# Patient Record
Sex: Male | Born: 1945 | Hispanic: No | Smoking: Current some day smoker
Health system: Southern US, Community
[De-identification: ages and names within clinical notes are randomized; demographics above are authoritative.]

## PROBLEM LIST (undated history)

## (undated) DIAGNOSIS — F419 Anxiety disorder, unspecified: Secondary | ICD-10-CM

## (undated) DIAGNOSIS — R12 Heartburn: Secondary | ICD-10-CM

## (undated) HISTORY — DX: Heartburn: R12

## (undated) HISTORY — PX: COLONOSCOPY: SHX174

---

## 2003-10-01 ENCOUNTER — Other Ambulatory Visit: Admission: RE | Admit: 2003-10-01 | Discharge: 2003-10-01 | Payer: Self-pay | Admitting: Dermatology

## 2003-12-25 ENCOUNTER — Emergency Department (HOSPITAL_COMMUNITY): Admission: EM | Admit: 2003-12-25 | Discharge: 2003-12-26 | Payer: Self-pay | Admitting: Emergency Medicine

## 2015-11-19 DIAGNOSIS — Z0000 Encounter for general adult medical examination without abnormal findings: Secondary | ICD-10-CM | POA: Diagnosis not present

## 2015-11-19 DIAGNOSIS — Z1389 Encounter for screening for other disorder: Secondary | ICD-10-CM | POA: Diagnosis not present

## 2015-11-19 DIAGNOSIS — Z6828 Body mass index (BMI) 28.0-28.9, adult: Secondary | ICD-10-CM | POA: Diagnosis not present

## 2015-11-19 DIAGNOSIS — K210 Gastro-esophageal reflux disease with esophagitis: Secondary | ICD-10-CM | POA: Diagnosis not present

## 2015-11-26 DIAGNOSIS — R1084 Generalized abdominal pain: Secondary | ICD-10-CM | POA: Diagnosis not present

## 2015-11-26 DIAGNOSIS — N281 Cyst of kidney, acquired: Secondary | ICD-10-CM | POA: Diagnosis not present

## 2015-11-26 DIAGNOSIS — K8020 Calculus of gallbladder without cholecystitis without obstruction: Secondary | ICD-10-CM | POA: Diagnosis not present

## 2016-02-18 DIAGNOSIS — Z6828 Body mass index (BMI) 28.0-28.9, adult: Secondary | ICD-10-CM | POA: Diagnosis not present

## 2016-02-18 DIAGNOSIS — K210 Gastro-esophageal reflux disease with esophagitis: Secondary | ICD-10-CM | POA: Diagnosis not present

## 2016-05-20 DIAGNOSIS — K210 Gastro-esophageal reflux disease with esophagitis: Secondary | ICD-10-CM | POA: Diagnosis not present

## 2016-05-20 DIAGNOSIS — R69 Illness, unspecified: Secondary | ICD-10-CM | POA: Diagnosis not present

## 2016-05-20 DIAGNOSIS — Z6827 Body mass index (BMI) 27.0-27.9, adult: Secondary | ICD-10-CM | POA: Diagnosis not present

## 2016-08-19 DIAGNOSIS — R69 Illness, unspecified: Secondary | ICD-10-CM | POA: Diagnosis not present

## 2016-08-19 DIAGNOSIS — K210 Gastro-esophageal reflux disease with esophagitis: Secondary | ICD-10-CM | POA: Diagnosis not present

## 2016-08-19 DIAGNOSIS — E663 Overweight: Secondary | ICD-10-CM | POA: Diagnosis not present

## 2016-08-19 DIAGNOSIS — Z6827 Body mass index (BMI) 27.0-27.9, adult: Secondary | ICD-10-CM | POA: Diagnosis not present

## 2016-08-19 DIAGNOSIS — L570 Actinic keratosis: Secondary | ICD-10-CM | POA: Diagnosis not present

## 2016-11-25 DIAGNOSIS — E663 Overweight: Secondary | ICD-10-CM | POA: Diagnosis not present

## 2016-11-25 DIAGNOSIS — K210 Gastro-esophageal reflux disease with esophagitis: Secondary | ICD-10-CM | POA: Diagnosis not present

## 2016-11-25 DIAGNOSIS — Z6827 Body mass index (BMI) 27.0-27.9, adult: Secondary | ICD-10-CM | POA: Diagnosis not present

## 2016-11-25 DIAGNOSIS — R69 Illness, unspecified: Secondary | ICD-10-CM | POA: Diagnosis not present

## 2017-02-17 DIAGNOSIS — K210 Gastro-esophageal reflux disease with esophagitis: Secondary | ICD-10-CM | POA: Diagnosis not present

## 2017-02-17 DIAGNOSIS — R69 Illness, unspecified: Secondary | ICD-10-CM | POA: Diagnosis not present

## 2017-02-17 DIAGNOSIS — Z125 Encounter for screening for malignant neoplasm of prostate: Secondary | ICD-10-CM | POA: Diagnosis not present

## 2017-02-17 DIAGNOSIS — E663 Overweight: Secondary | ICD-10-CM | POA: Diagnosis not present

## 2017-02-17 DIAGNOSIS — Z6827 Body mass index (BMI) 27.0-27.9, adult: Secondary | ICD-10-CM | POA: Diagnosis not present

## 2017-02-17 DIAGNOSIS — Z79899 Other long term (current) drug therapy: Secondary | ICD-10-CM | POA: Diagnosis not present

## 2017-03-02 DIAGNOSIS — N200 Calculus of kidney: Secondary | ICD-10-CM | POA: Diagnosis not present

## 2017-03-02 DIAGNOSIS — N281 Cyst of kidney, acquired: Secondary | ICD-10-CM | POA: Diagnosis not present

## 2017-03-02 DIAGNOSIS — K8020 Calculus of gallbladder without cholecystitis without obstruction: Secondary | ICD-10-CM | POA: Diagnosis not present

## 2017-03-02 DIAGNOSIS — I700 Atherosclerosis of aorta: Secondary | ICD-10-CM | POA: Diagnosis not present

## 2017-03-10 DIAGNOSIS — K8020 Calculus of gallbladder without cholecystitis without obstruction: Secondary | ICD-10-CM | POA: Diagnosis not present

## 2017-03-10 DIAGNOSIS — N281 Cyst of kidney, acquired: Secondary | ICD-10-CM | POA: Diagnosis not present

## 2017-03-10 DIAGNOSIS — N2889 Other specified disorders of kidney and ureter: Secondary | ICD-10-CM | POA: Diagnosis not present

## 2017-03-10 DIAGNOSIS — K449 Diaphragmatic hernia without obstruction or gangrene: Secondary | ICD-10-CM | POA: Diagnosis not present

## 2017-05-19 DIAGNOSIS — E663 Overweight: Secondary | ICD-10-CM | POA: Diagnosis not present

## 2017-05-19 DIAGNOSIS — K210 Gastro-esophageal reflux disease with esophagitis: Secondary | ICD-10-CM | POA: Diagnosis not present

## 2017-05-19 DIAGNOSIS — Z6827 Body mass index (BMI) 27.0-27.9, adult: Secondary | ICD-10-CM | POA: Diagnosis not present

## 2017-05-19 DIAGNOSIS — R69 Illness, unspecified: Secondary | ICD-10-CM | POA: Diagnosis not present

## 2017-07-10 DIAGNOSIS — Z1211 Encounter for screening for malignant neoplasm of colon: Secondary | ICD-10-CM | POA: Diagnosis not present

## 2017-08-16 DIAGNOSIS — Z6827 Body mass index (BMI) 27.0-27.9, adult: Secondary | ICD-10-CM | POA: Diagnosis not present

## 2017-08-16 DIAGNOSIS — R69 Illness, unspecified: Secondary | ICD-10-CM | POA: Diagnosis not present

## 2017-08-16 DIAGNOSIS — E663 Overweight: Secondary | ICD-10-CM | POA: Diagnosis not present

## 2017-08-16 DIAGNOSIS — Z1389 Encounter for screening for other disorder: Secondary | ICD-10-CM | POA: Diagnosis not present

## 2017-08-16 DIAGNOSIS — Z0000 Encounter for general adult medical examination without abnormal findings: Secondary | ICD-10-CM | POA: Diagnosis not present

## 2017-08-16 DIAGNOSIS — K210 Gastro-esophageal reflux disease with esophagitis: Secondary | ICD-10-CM | POA: Diagnosis not present

## 2017-08-16 DIAGNOSIS — R7303 Prediabetes: Secondary | ICD-10-CM | POA: Diagnosis not present

## 2017-11-16 DIAGNOSIS — K210 Gastro-esophageal reflux disease with esophagitis: Secondary | ICD-10-CM | POA: Diagnosis not present

## 2017-11-16 DIAGNOSIS — Z6827 Body mass index (BMI) 27.0-27.9, adult: Secondary | ICD-10-CM | POA: Diagnosis not present

## 2017-11-16 DIAGNOSIS — R69 Illness, unspecified: Secondary | ICD-10-CM | POA: Diagnosis not present

## 2017-11-16 DIAGNOSIS — E663 Overweight: Secondary | ICD-10-CM | POA: Diagnosis not present

## 2018-02-14 DIAGNOSIS — R69 Illness, unspecified: Secondary | ICD-10-CM | POA: Diagnosis not present

## 2018-02-14 DIAGNOSIS — Z6827 Body mass index (BMI) 27.0-27.9, adult: Secondary | ICD-10-CM | POA: Diagnosis not present

## 2018-02-14 DIAGNOSIS — K210 Gastro-esophageal reflux disease with esophagitis: Secondary | ICD-10-CM | POA: Diagnosis not present

## 2018-05-17 DIAGNOSIS — K210 Gastro-esophageal reflux disease with esophagitis: Secondary | ICD-10-CM | POA: Diagnosis not present

## 2018-05-17 DIAGNOSIS — R69 Illness, unspecified: Secondary | ICD-10-CM | POA: Diagnosis not present

## 2018-05-17 DIAGNOSIS — Z6826 Body mass index (BMI) 26.0-26.9, adult: Secondary | ICD-10-CM | POA: Diagnosis not present

## 2018-05-18 DIAGNOSIS — Z1159 Encounter for screening for other viral diseases: Secondary | ICD-10-CM | POA: Diagnosis not present

## 2018-05-18 DIAGNOSIS — R69 Illness, unspecified: Secondary | ICD-10-CM | POA: Diagnosis not present

## 2018-05-18 DIAGNOSIS — K210 Gastro-esophageal reflux disease with esophagitis: Secondary | ICD-10-CM | POA: Diagnosis not present

## 2018-05-18 DIAGNOSIS — Z6827 Body mass index (BMI) 27.0-27.9, adult: Secondary | ICD-10-CM | POA: Diagnosis not present

## 2018-08-20 DIAGNOSIS — Z6827 Body mass index (BMI) 27.0-27.9, adult: Secondary | ICD-10-CM | POA: Diagnosis not present

## 2018-08-20 DIAGNOSIS — Z0000 Encounter for general adult medical examination without abnormal findings: Secondary | ICD-10-CM | POA: Diagnosis not present

## 2018-08-20 DIAGNOSIS — K210 Gastro-esophageal reflux disease with esophagitis: Secondary | ICD-10-CM | POA: Diagnosis not present

## 2018-08-20 DIAGNOSIS — Z1389 Encounter for screening for other disorder: Secondary | ICD-10-CM | POA: Diagnosis not present

## 2018-08-20 DIAGNOSIS — R69 Illness, unspecified: Secondary | ICD-10-CM | POA: Diagnosis not present

## 2018-11-20 DIAGNOSIS — Z6827 Body mass index (BMI) 27.0-27.9, adult: Secondary | ICD-10-CM | POA: Diagnosis not present

## 2018-11-20 DIAGNOSIS — K210 Gastro-esophageal reflux disease with esophagitis: Secondary | ICD-10-CM | POA: Diagnosis not present

## 2018-11-20 DIAGNOSIS — R69 Illness, unspecified: Secondary | ICD-10-CM | POA: Diagnosis not present

## 2019-02-20 DIAGNOSIS — I10 Essential (primary) hypertension: Secondary | ICD-10-CM | POA: Diagnosis not present

## 2019-02-20 DIAGNOSIS — Z6827 Body mass index (BMI) 27.0-27.9, adult: Secondary | ICD-10-CM | POA: Diagnosis not present

## 2019-02-20 DIAGNOSIS — R69 Illness, unspecified: Secondary | ICD-10-CM | POA: Diagnosis not present

## 2019-02-20 DIAGNOSIS — K210 Gastro-esophageal reflux disease with esophagitis: Secondary | ICD-10-CM | POA: Diagnosis not present

## 2019-05-22 DIAGNOSIS — Z6828 Body mass index (BMI) 28.0-28.9, adult: Secondary | ICD-10-CM | POA: Diagnosis not present

## 2019-05-22 DIAGNOSIS — I10 Essential (primary) hypertension: Secondary | ICD-10-CM | POA: Diagnosis not present

## 2019-05-22 DIAGNOSIS — K210 Gastro-esophageal reflux disease with esophagitis: Secondary | ICD-10-CM | POA: Diagnosis not present

## 2019-05-22 DIAGNOSIS — R69 Illness, unspecified: Secondary | ICD-10-CM | POA: Diagnosis not present

## 2019-08-22 DIAGNOSIS — R69 Illness, unspecified: Secondary | ICD-10-CM | POA: Diagnosis not present

## 2019-08-22 DIAGNOSIS — K219 Gastro-esophageal reflux disease without esophagitis: Secondary | ICD-10-CM | POA: Diagnosis not present

## 2019-08-22 DIAGNOSIS — Z0000 Encounter for general adult medical examination without abnormal findings: Secondary | ICD-10-CM | POA: Diagnosis not present

## 2019-08-22 DIAGNOSIS — Z6827 Body mass index (BMI) 27.0-27.9, adult: Secondary | ICD-10-CM | POA: Diagnosis not present

## 2019-08-22 DIAGNOSIS — I10 Essential (primary) hypertension: Secondary | ICD-10-CM | POA: Diagnosis not present

## 2019-08-22 DIAGNOSIS — Z1389 Encounter for screening for other disorder: Secondary | ICD-10-CM | POA: Diagnosis not present

## 2019-08-23 DIAGNOSIS — Z125 Encounter for screening for malignant neoplasm of prostate: Secondary | ICD-10-CM | POA: Diagnosis not present

## 2019-11-21 DIAGNOSIS — R69 Illness, unspecified: Secondary | ICD-10-CM | POA: Diagnosis not present

## 2019-11-21 DIAGNOSIS — Z6827 Body mass index (BMI) 27.0-27.9, adult: Secondary | ICD-10-CM | POA: Diagnosis not present

## 2019-11-21 DIAGNOSIS — I10 Essential (primary) hypertension: Secondary | ICD-10-CM | POA: Diagnosis not present

## 2019-11-21 DIAGNOSIS — K2100 Gastro-esophageal reflux disease with esophagitis, without bleeding: Secondary | ICD-10-CM | POA: Diagnosis not present

## 2020-11-11 ENCOUNTER — Ambulatory Visit (HOSPITAL_COMMUNITY)
Admission: RE | Admit: 2020-11-11 | Discharge: 2020-11-11 | Disposition: A | Discharge status: Home / Self Care | Payer: Medicare HMO | Source: Ambulatory Visit | Attending: Urology | Admitting: Urology

## 2020-11-11 ENCOUNTER — Encounter: Payer: Self-pay | Admitting: Urology

## 2020-11-11 ENCOUNTER — Other Ambulatory Visit: Payer: Self-pay

## 2020-11-11 ENCOUNTER — Ambulatory Visit (INDEPENDENT_AMBULATORY_CARE_PROVIDER_SITE_OTHER): Payer: Medicare HMO | Admitting: Urology

## 2020-11-11 VITALS — BP 164/92 | HR 73 | Temp 98.4°F | Ht 76.0 in | Wt 216.0 lb

## 2020-11-11 DIAGNOSIS — N200 Calculus of kidney: Secondary | ICD-10-CM | POA: Diagnosis present

## 2020-11-11 MED ORDER — ONDANSETRON 4 MG PO TBDP: 4.0000 mg | ORAL_TABLET | Freq: Three times a day (TID) | ORAL | 0 refills | Status: DC | PRN

## 2020-11-11 MED ORDER — TAMSULOSIN HCL 0.4 MG PO CAPS
0.4000 mg | ORAL_CAPSULE | Freq: Every day | ORAL | 1 refills | Status: AC
Start: 2020-11-11 — End: ?

## 2020-11-11 NOTE — Progress Notes (Signed)
Urological Symptom Review  Patient is experiencing the following symptoms: Get up at night to urinate Weak stream   Review of Systems  Gastrointestinal (upper)  : Negative for upper GI symptoms  Gastrointestinal (lower) : Negative for lower GI symptoms  Constitutional : Negative for symptoms  Skin: Negative for skin symptoms  Eyes: Negative for eye symptoms  Ear/Nose/Throat : negative  Hematologic/Lymphatic: Negative for Hematologic/Lymphatic symptoms  Cardiovascular : Negative for cardiovascular symptoms  Respiratory : Negative for respiratory symptoms  Endocrine: Negative for endocrine symptoms  Musculoskeletal: Negative for musculoskeletal symptoms  Neurological: Negative for neurological symptoms  Psychologic: Negative for psychiatric symptoms

## 2020-11-11 NOTE — Patient Instructions (Signed)

## 2020-11-12 ENCOUNTER — Telehealth: Payer: Self-pay

## 2020-11-12 LAB — URINALYSIS, ROUTINE W REFLEX MICROSCOPIC
Bilirubin, UA: NEGATIVE
Glucose, UA: NEGATIVE
Ketones, UA: NEGATIVE
Leukocytes,UA: NEGATIVE
Nitrite, UA: NEGATIVE
Protein,UA: NEGATIVE
Specific Gravity, UA: 1.02 (ref 1.005–1.030)
Urobilinogen, Ur: 0.2 mg/dL (ref 0.2–1.0)
pH, UA: 5.5 (ref 5.0–7.5)

## 2020-11-12 LAB — MICROSCOPIC EXAMINATION
Bacteria, UA: NONE SEEN
Epithelial Cells (non renal): NONE SEEN /hpf (ref 0–10)
Renal Epithel, UA: NONE SEEN /hpf
WBC, UA: NONE SEEN /hpf (ref 0–5)

## 2020-11-13 ENCOUNTER — Other Ambulatory Visit: Payer: Self-pay

## 2020-11-13 ENCOUNTER — Ambulatory Visit: Payer: Medicare HMO

## 2020-11-13 DIAGNOSIS — N200 Calculus of kidney: Secondary | ICD-10-CM

## 2020-11-13 NOTE — Progress Notes (Signed)
11/11/2020 9:18 AM   Cameron Chaney [birth year] [id number]  Referring provider: Toma Deiters, MD [address] [place],  [place] [address]  Flank pain  HPI: Cameron Chaney is a 74yo here for evaluation of left ureteral calculus. He presented to [facility] [place] on 10/13/2020 and was diagnosed with a 13mm left proximal ureteral calculus. He has not passed his calculus. He continues to have sharp, intermittent, mild to moderate left flank pain. He has associated nausea and decreased appetite. No vomiting. No other associated symptoms. No exacerbating/alleviating events. He denies any worsening LUTS. No hematuria.   PMH: Past Medical History:  Diagnosis Date  . Heartburn     Surgical History: History reviewed. No pertinent surgical history.  Home Medications:  Allergies as of 11/11/2020   No Known Allergies     Medication List       Accurate as of November 11, 2020 11:59 PM. If you have any questions, ask your nurse or doctor.        ALPRAZolam 0.5 MG tablet Commonly known as: XANAX Take by mouth.   aspirin 81 MG chewable tablet Chew by mouth daily.   calcium carbonate 1500 (600 Ca) MG Tabs tablet Commonly known as: OSCAL Take by mouth 2 (two) times daily with a meal.   mirtazapine 15 MG tablet Commonly known as: REMERON Take 15 mg by mouth at bedtime.   ondansetron 4 MG disintegrating tablet Commonly known as: Zofran ODT Take 1 tablet (4 mg total) by mouth every 8 (eight) hours as needed for nausea or vomiting. Started by: Cameron Aye, MD   PROTONIX PO Take by mouth daily as needed.   tamsulosin 0.4 MG Caps capsule Commonly known as: FLOMAX Take 1 capsule (0.4 mg total) by mouth daily after supper. What changed: when to take this Changed by: Cameron Aye, MD       Allergies: No Known Allergies  Family History: History reviewed. No pertinent family history.  Social History:  reports that he has been smoking. He has smoked for the past 20.00  years. He has never used smokeless tobacco. No history on file for alcohol use and drug use.  ROS: All other review of systems were reviewed and are negative except what is noted above in HPI  Physical Exam: BP (!) 164/92   Pulse 73   Temp 98.4 F (36.9 C)   Ht 6\' 4"  (1.93 m)   Wt 216 lb (98 kg)   BMI 26.29 kg/m   Constitutional:  Alert and oriented, No acute distress. HEENT: Cameron Chaney AT, moist mucus membranes.  Trachea midline, no masses. Cardiovascular: No clubbing, cyanosis, or edema. Respiratory: Normal respiratory effort, no increased work of breathing. GI: Abdomen is soft, nontender, nondistended, no abdominal masses GU: No CVA tenderness.  Lymph: No cervical or inguinal lymphadenopathy. Skin: No rashes, bruises or suspicious lesions. Neurologic: Grossly intact, no focal deficits, moving all 4 extremities. Psychiatric: Normal mood and affect.  Laboratory Data: No results found for: WBC, HGB, HCT, MCV, PLT  No results found for: CREATININE  No results found for: PSA  No results found for: TESTOSTERONE  No results found for: HGBA1C  Urinalysis    Component Value Date/Time   APPEARANCEUR Clear 11/11/2020 1609   GLUCOSEU Negative 11/11/2020 1609   BILIRUBINUR Negative 11/11/2020 1609   PROTEINUR Negative 11/11/2020 1609   NITRITE Negative 11/11/2020 1609   LEUKOCYTESUR Negative 11/11/2020 1609    Lab Results  Component Value Date   LABMICR See below: 11/11/2020  WBCUA None seen 11/11/2020   LABEPIT None seen 11/11/2020   BACTERIA None seen 11/11/2020    Pertinent Imaging: CT 10/13/2020: Images reviewed and discussed with the patient Results for orders placed in visit on 11/11/20  Abdomen 1 view (KUB)  Narrative CLINICAL DATA:  Nephrolithiasis  EXAM: ABDOMEN - 1 VIEW  COMPARISON:  Correlation made with prior CT 10/13/2020  FINDINGS: There are no definite urinary tract calculi identified. Bowel gas pattern is unremarkable. No acute osseous  abnormality.  IMPRESSION: No definite urinary tract calculi identified.   Electronically Signed By: Cameron  Chaney M.D. On: 11/11/2020 17:03  No results found for this or any previous visit.  No results found for this or any previous visit.  No results found for this or any previous visit.  No results found for this or any previous visit.  No results found for this or any previous visit.  No results found for this or any previous visit.  No results found for this or any previous visit.   Assessment & Plan:    1. Kidney stones -We discussed the management of kidney stones. These options include observation, ureteroscopy, shockwave lithotripsy (ESWL) and percutaneous nephrolithotomy (PCNL). We discussed which options are relevant to the patient's stone(s). We discussed the natural history of kidney stones as well as the complications of untreated stones and the impact on quality of life without treatment as well as with each of the above listed treatments. We also discussed the efficacy of each treatment in its ability to clear the stone burden. With any of these management options I discussed the signs and symptoms of infection and the need for emergent treatment should these be experienced. For each option we discussed the ability of each procedure to clear the patient of their stone burden.   For observation I described the risks which include but are not limited to silent renal damage, life-threatening infection, need for emergent surgery, failure to pass stone and pain.   For ureteroscopy I described the risks which include bleeding, infection, damage to contiguous structures, positioning injury, ureteral stricture, ureteral avulsion, ureteral injury, need for prolonged ureteral stent, inability to perform ureteroscopy, need for an interval procedure, inability to clear stone burden, stent discomfort/pain, heart attack, stroke, pulmonary embolus and the inherent risks with general  anesthesia.   For shockwave lithotripsy I described the risks which include arrhythmia, kidney contusion, kidney hemorrhage, need for transfusion, pain, inability to adequately break up stone, inability to pass stone fragments, Steinstrasse, infection associated with obstructing stones, need for alternate surgical procedure, need for repeat shockwave lithotripsy, MI, CVA, PE and the inherent risks with anesthesia/conscious sedation.   For PCNL I described the risks including positioning injury, pneumothorax, hydrothorax, need for chest tube, inability to clear stone burden, renal laceration, arterial venous fistula or malformation, need for embolization of kidney, loss of kidney or renal function, need for repeat procedure, need for prolonged nephrostomy tube, ureteral avulsion, MI, CVA, PE and the inherent risks of general anesthesia.   - The patient would like to proceed with Left ESWL  - Urinalysis, Routine w reflex microscopic - Abdomen 1 view (KUB)   No follow-ups on file.  Hadden Steig, MD  [facility] [place]   

## 2020-11-13 NOTE — H&P (View-Only) (Signed)
11/11/2020 9:18 AM   Cameron Chaney [birth year] [id number]  Referring provider: Toma Deiters, MD [address] [place],  [place] [address]  Flank pain  HPI: [name] is a 75yo here for evaluation of left ureteral calculus. He presented to [facility] [place] on 10/13/2020 and was diagnosed with a 13mm left proximal ureteral calculus. He has not passed his calculus. He continues to have sharp, intermittent, mild to moderate left flank pain. He has associated nausea and decreased appetite. No vomiting. No other associated symptoms. No exacerbating/alleviating events. He denies any worsening LUTS. No hematuria.   PMH: Past Medical History:  Diagnosis Date  . Heartburn     Surgical History: History reviewed. No pertinent surgical history.  Home Medications:  Allergies as of 11/11/2020   No Known Allergies     Medication List       Accurate as of November 11, 2020 11:59 PM. If you have any questions, ask your nurse or doctor.        ALPRAZolam 0.5 MG tablet Commonly known as: XANAX Take by mouth.   aspirin 81 MG chewable tablet Chew by mouth daily.   calcium carbonate 1500 (600 Ca) MG Tabs tablet Commonly known as: OSCAL Take by mouth 2 (two) times daily with a meal.   mirtazapine 15 MG tablet Commonly known as: REMERON Take 15 mg by mouth at bedtime.   ondansetron 4 MG disintegrating tablet Commonly known as: Zofran ODT Take 1 tablet (4 mg total) by mouth every 8 (eight) hours as needed for nausea or vomiting. Started by: Wilkie Aye, MD   PROTONIX PO Take by mouth daily as needed.   tamsulosin 0.4 MG Caps capsule Commonly known as: FLOMAX Take 1 capsule (0.4 mg total) by mouth daily after supper. What changed: when to take this Changed by: Wilkie Aye, MD       Allergies: No Known Allergies  Family History: History reviewed. No pertinent family history.  Social History:  reports that he has been smoking. He has smoked for the past 20.00  years. He has never used smokeless tobacco. No history on file for alcohol use and drug use.  ROS: All other review of systems were reviewed and are negative except what is noted above in HPI  Physical Exam: BP (!) 164/92   Pulse 73   Temp 98.4 F (36.9 C)   Ht 6\' 4"  (1.93 m)   Wt 216 lb (98 kg)   BMI 26.29 kg/m   Constitutional:  Alert and oriented, No acute distress. HEENT: [place] AT, moist mucus membranes.  Trachea midline, no masses. Cardiovascular: No clubbing, cyanosis, or edema. Respiratory: Normal respiratory effort, no increased work of breathing. GI: Abdomen is soft, nontender, nondistended, no abdominal masses GU: No CVA tenderness.  Lymph: No cervical or inguinal lymphadenopathy. Skin: No rashes, bruises or suspicious lesions. Neurologic: Grossly intact, no focal deficits, moving all 4 extremities. Psychiatric: Normal mood and affect.  Laboratory Data: No results found for: WBC, HGB, HCT, MCV, PLT  No results found for: CREATININE  No results found for: PSA  No results found for: TESTOSTERONE  No results found for: HGBA1C  Urinalysis    Component Value Date/Time   APPEARANCEUR Clear 11/11/2020 1609   GLUCOSEU Negative 11/11/2020 1609   BILIRUBINUR Negative 11/11/2020 1609   PROTEINUR Negative 11/11/2020 1609   NITRITE Negative 11/11/2020 1609   LEUKOCYTESUR Negative 11/11/2020 1609    Lab Results  Component Value Date   LABMICR See below: 11/11/2020  WBCUA None seen 11/11/2020   LABEPIT None seen 11/11/2020   BACTERIA None seen 11/11/2020    Pertinent Imaging: CT 10/13/2020: Images reviewed and discussed with the patient Results for orders placed in visit on 11/11/20  Abdomen 1 view (KUB)  Narrative CLINICAL DATA:  Nephrolithiasis  EXAM: ABDOMEN - 1 VIEW  COMPARISON:  Correlation made with prior CT 10/13/2020  FINDINGS: There are no definite urinary tract calculi identified. Bowel gas pattern is unremarkable. No acute osseous  abnormality.  IMPRESSION: No definite urinary tract calculi identified.   Electronically Signed By: Guadlupe Spanish M.D. On: 11/11/2020 17:03  No results found for this or any previous visit.  No results found for this or any previous visit.  No results found for this or any previous visit.  No results found for this or any previous visit.  No results found for this or any previous visit.  No results found for this or any previous visit.  No results found for this or any previous visit.   Assessment & Plan:    1. Kidney stones -We discussed the management of kidney stones. These options include observation, ureteroscopy, shockwave lithotripsy (ESWL) and percutaneous nephrolithotomy (PCNL). We discussed which options are relevant to the patient's stone(s). We discussed the natural history of kidney stones as well as the complications of untreated stones and the impact on quality of life without treatment as well as with each of the above listed treatments. We also discussed the efficacy of each treatment in its ability to clear the stone burden. With any of these management options I discussed the signs and symptoms of infection and the need for emergent treatment should these be experienced. For each option we discussed the ability of each procedure to clear the patient of their stone burden.   For observation I described the risks which include but are not limited to silent renal damage, life-threatening infection, need for emergent surgery, failure to pass stone and pain.   For ureteroscopy I described the risks which include bleeding, infection, damage to contiguous structures, positioning injury, ureteral stricture, ureteral avulsion, ureteral injury, need for prolonged ureteral stent, inability to perform ureteroscopy, need for an interval procedure, inability to clear stone burden, stent discomfort/pain, heart attack, stroke, pulmonary embolus and the inherent risks with general  anesthesia.   For shockwave lithotripsy I described the risks which include arrhythmia, kidney contusion, kidney hemorrhage, need for transfusion, pain, inability to adequately break up stone, inability to pass stone fragments, Steinstrasse, infection associated with obstructing stones, need for alternate surgical procedure, need for repeat shockwave lithotripsy, MI, CVA, PE and the inherent risks with anesthesia/conscious sedation.   For PCNL I described the risks including positioning injury, pneumothorax, hydrothorax, need for chest tube, inability to clear stone burden, renal laceration, arterial venous fistula or malformation, need for embolization of kidney, loss of kidney or renal function, need for repeat procedure, need for prolonged nephrostomy tube, ureteral avulsion, MI, CVA, PE and the inherent risks of general anesthesia.   - The patient would like to proceed with Left ESWL  - Urinalysis, Routine w reflex microscopic - Abdomen 1 view (KUB)   No follow-ups on file.  Wilkie Aye, MD  [facility] [place]

## 2020-11-13 NOTE — Progress Notes (Signed)
Litho instructions reviewed with patient. Voiced understanding of instructions.

## 2020-11-20 ENCOUNTER — Other Ambulatory Visit: Payer: Self-pay

## 2020-11-20 ENCOUNTER — Encounter (HOSPITAL_COMMUNITY): Payer: Self-pay

## 2020-11-20 ENCOUNTER — Encounter (HOSPITAL_COMMUNITY)
Admission: RE | Admit: 2020-11-20 | Discharge: 2020-11-20 | Disposition: A | Discharge status: Home / Self Care | Payer: Medicare HMO | Source: Ambulatory Visit | Attending: Urology | Admitting: Urology

## 2020-11-20 HISTORY — DX: Anxiety disorder, unspecified: F41.9

## 2020-11-23 ENCOUNTER — Other Ambulatory Visit (HOSPITAL_COMMUNITY)
Admission: RE | Admit: 2020-11-23 | Discharge: 2020-11-23 | Disposition: A | Discharge status: Home / Self Care | Payer: Medicare HMO | Source: Ambulatory Visit | Attending: Urology | Admitting: Urology

## 2020-11-23 ENCOUNTER — Other Ambulatory Visit: Payer: Self-pay

## 2020-11-23 DIAGNOSIS — Z01812 Encounter for preprocedural laboratory examination: Secondary | ICD-10-CM | POA: Insufficient documentation

## 2020-11-23 DIAGNOSIS — Z20822 Contact with and (suspected) exposure to covid-19: Secondary | ICD-10-CM | POA: Insufficient documentation

## 2020-11-23 LAB — SARS CORONAVIRUS 2 (TAT 6-24 HRS): SARS Coronavirus 2: NEGATIVE

## 2020-11-24 ENCOUNTER — Ambulatory Visit (HOSPITAL_COMMUNITY)
Admission: RE | Admit: 2020-11-24 | Discharge: 2020-11-24 | Disposition: A | Discharge status: Home / Self Care | Payer: Medicare HMO | Source: Ambulatory Visit | Attending: Urology | Admitting: Urology

## 2020-11-24 ENCOUNTER — Encounter (HOSPITAL_COMMUNITY)
Admission: RE | Disposition: A | Discharge status: Home / Self Care | Payer: Self-pay | Source: Ambulatory Visit | Attending: Urology

## 2020-11-24 DIAGNOSIS — N200 Calculus of kidney: Secondary | ICD-10-CM | POA: Diagnosis present

## 2020-11-24 DIAGNOSIS — N201 Calculus of ureter: Secondary | ICD-10-CM

## 2020-11-24 HISTORY — PX: EXTRACORPOREAL SHOCK WAVE LITHOTRIPSY: SHX1557

## 2020-11-24 SURGERY — LITHOTRIPSY, ESWL
Anesthesia: LOCAL | Laterality: Left

## 2020-11-24 MED ORDER — SODIUM CHLORIDE 0.9 % IV SOLN: INTRAVENOUS | Status: DC

## 2020-11-24 MED ORDER — ONDANSETRON 4 MG PO TBDP: 4.0000 mg | ORAL_TABLET | Freq: Three times a day (TID) | ORAL | 0 refills | Status: AC | PRN

## 2020-11-24 MED ORDER — DIPHENHYDRAMINE HCL 25 MG PO CAPS
25.0000 mg | ORAL_CAPSULE | ORAL | Status: AC
  Administered 2020-11-24: 25 mg via ORAL
  Filled 2020-11-24: qty 1

## 2020-11-24 MED ORDER — DIAZEPAM 5 MG PO TABS
10.0000 mg | ORAL_TABLET | Freq: Once | ORAL | Status: AC
  Administered 2020-11-24: 10 mg via ORAL
  Filled 2020-11-24: qty 2

## 2020-11-24 MED ORDER — OXYCODONE-ACETAMINOPHEN 5-325 MG PO TABS: 1.0000 | ORAL_TABLET | ORAL | 0 refills | Status: AC | PRN

## 2020-11-24 NOTE — Interval H&P Note (Signed)
History and Physical Interval Note:  11/24/2020 10:17 AM  Cameron Chaney  has presented today for surgery, with the diagnosis of left ureteral calculus.  The various methods of treatment have been discussed with the patient and family. After consideration of risks, benefits and other options for treatment, the patient has consented to  Procedure(s): EXTRACORPOREAL SHOCK WAVE LITHOTRIPSY (ESWL) (Left) as a surgical intervention.  The patient's history has been reviewed, patient examined, no change in status, stable for surgery.  I have reviewed the patient's chart and labs.  Questions were answered to the patient's satisfaction.     Wilkie Aye

## 2020-11-24 NOTE — Interval H&P Note (Signed)
History and Physical Interval Note:  11/24/2020 8:52 AM  Cameron Chaney  has presented today for surgery, with the diagnosis of left ureteral calculus.  The various methods of treatment have been discussed with the patient and family. After consideration of risks, benefits and other options for treatment, the patient has consented to  Procedure(s): EXTRACORPOREAL SHOCK WAVE LITHOTRIPSY (ESWL) (Left) as a surgical intervention.  The patient's history has been reviewed, patient examined, no change in status, stable for surgery.  I have reviewed the patient's chart and labs.  Questions were answered to the patient's satisfaction.     Wilkie Aye

## 2020-11-24 NOTE — Discharge Instructions (Signed)

## 2020-11-26 ENCOUNTER — Encounter (HOSPITAL_COMMUNITY): Payer: Self-pay | Admitting: Urology

## 2020-11-30 NOTE — Telephone Encounter (Signed)
Per Dr. Ronne Binning patient informed to try zofran for  appetite issues. X-ray results given. 11/12/20

## 2020-12-10 ENCOUNTER — Other Ambulatory Visit: Payer: Self-pay

## 2020-12-10 ENCOUNTER — Ambulatory Visit (HOSPITAL_COMMUNITY)
Admission: RE | Admit: 2020-12-10 | Discharge: 2020-12-10 | Disposition: A | Discharge status: Home / Self Care | Payer: Medicare HMO | Source: Ambulatory Visit | Attending: Urology | Admitting: Urology

## 2020-12-10 DIAGNOSIS — N200 Calculus of kidney: Secondary | ICD-10-CM | POA: Diagnosis not present

## 2020-12-11 ENCOUNTER — Ambulatory Visit (INDEPENDENT_AMBULATORY_CARE_PROVIDER_SITE_OTHER): Payer: Medicare HMO | Admitting: Urology

## 2020-12-11 ENCOUNTER — Ambulatory Visit: Payer: Medicare HMO | Admitting: Urology

## 2020-12-11 ENCOUNTER — Encounter: Payer: Self-pay | Admitting: Urology

## 2020-12-11 VITALS — BP 139/84 | HR 79 | Temp 98.2°F | Ht 76.0 in | Wt 212.0 lb

## 2020-12-11 DIAGNOSIS — N200 Calculus of kidney: Secondary | ICD-10-CM

## 2020-12-11 LAB — URINALYSIS, ROUTINE W REFLEX MICROSCOPIC
Bilirubin, UA: NEGATIVE
Glucose, UA: NEGATIVE
Ketones, UA: NEGATIVE
Leukocytes,UA: NEGATIVE
Nitrite, UA: NEGATIVE
Protein,UA: NEGATIVE
Specific Gravity, UA: 1.025 (ref 1.005–1.030)
Urobilinogen, Ur: 0.2 mg/dL (ref 0.2–1.0)
pH, UA: 5 (ref 5.0–7.5)

## 2020-12-11 LAB — MICROSCOPIC EXAMINATION
Bacteria, UA: NONE SEEN
Epithelial Cells (non renal): NONE SEEN /hpf (ref 0–10)
Renal Epithel, UA: NONE SEEN /hpf
WBC, UA: NONE SEEN /hpf (ref 0–5)

## 2020-12-11 NOTE — Progress Notes (Signed)
Urological Symptom Review  Patient is experiencing the following symptoms: Frequent urination Get up at night to urinate Weak stream   Review of Systems  Gastrointestinal (upper)  : Negative for upper GI symptoms  Gastrointestinal (lower) : Negative for lower GI symptoms  Constitutional : Weight loss  Skin: Negative for skin symptoms  Eyes: Negative for eye symptoms  Ear/Nose/Throat : Sinus problems  Hematologic/Lymphatic: Negative for Hematologic/Lymphatic symptoms  Cardiovascular : Negative for cardiovascular symptoms  Respiratory : Negative for respiratory symptoms  Endocrine: Negative for endocrine symptoms  Musculoskeletal: Negative for musculoskeletal symptoms  Neurological: Negative for neurological symptoms  Psychologic: Anxiety

## 2020-12-11 NOTE — Progress Notes (Signed)
12/11/2020 10:45 AM   [name] 07-15-1946 [id number]  Referring provider: Toma Deiters, MD [address] [place],  [place] [address]  followup after ESWL  HPI: [name] is a 75yo here for followup after ESWL. He passed a few fragments. Intermittent left flank pain that is very mild. KUB shows residual distal left ureteral fragments.    PMH: Past Medical History:  Diagnosis Date  . Anxiety   . Heartburn     Surgical History: Past Surgical History:  Procedure Laterality Date  . COLONOSCOPY    . EXTRACORPOREAL SHOCK WAVE LITHOTRIPSY Left 11/24/2020   Procedure: EXTRACORPOREAL SHOCK WAVE LITHOTRIPSY (ESWL);  Surgeon: Malen Gauze, MD;  Location: [facility];  Service: Urology;  Laterality: Left;    Home Medications:  Allergies as of 12/11/2020   No Known Allergies     Medication List       Accurate as of December 11, 2020 10:45 AM. If you have any questions, ask your nurse or doctor.        ALPRAZolam 0.5 MG tablet Commonly known as: XANAX Take 0.5 mg by mouth 3 (three) times daily.   aspirin 81 MG chewable tablet Chew 81 mg by mouth daily.   calcium carbonate 1500 (600 Ca) MG Tabs tablet Commonly known as: OSCAL Take 600 mg of elemental calcium by mouth 2 (two) times daily with a meal.   Fish Oil 1000 MG Caps Take 1,000 mg by mouth in the morning and at bedtime.   ondansetron 4 MG disintegrating tablet Commonly known as: Zofran ODT Take 1 tablet (4 mg total) by mouth every 8 (eight) hours as needed for nausea or vomiting.   oxyCODONE-acetaminophen 5-325 MG tablet Commonly known as: Percocet Take 1 tablet by mouth every 4 (four) hours as needed for severe pain.   pantoprazole 40 MG tablet Commonly known as: PROTONIX Take 40 mg by mouth daily as needed (acid reflux).   tamsulosin 0.4 MG Caps capsule Commonly known as: FLOMAX Take 1 capsule (0.4 mg total) by mouth daily after supper.       Allergies: No Known Allergies  Family  History: History reviewed. No pertinent family history.  Social History:  reports that he has been smoking. He has smoked for the past 20.00 years. He has never used smokeless tobacco. He reports previous alcohol use. He reports that he does not use drugs.  ROS: All other review of systems were reviewed and are negative except what is noted above in HPI  Physical Exam: BP 139/84   Pulse 79   Temp 98.2 F (36.8 C)   Ht 6\' 4"  (1.93 m)   Wt 212 lb (96.2 kg)   BMI 25.81 kg/m   Constitutional:  Alert and oriented, No acute distress. HEENT: [place] AT, moist mucus membranes.  Trachea midline, no masses. Cardiovascular: No clubbing, cyanosis, or edema. Respiratory: Normal respiratory effort, no increased work of breathing. GI: Abdomen is soft, nontender, nondistended, no abdominal masses GU: No CVA tenderness.  Lymph: No cervical or inguinal lymphadenopathy. Skin: No rashes, bruises or suspicious lesions. Neurologic: Grossly intact, no focal deficits, moving all 4 extremities. Psychiatric: Normal mood and affect.  Laboratory Data: No results found for: WBC, HGB, HCT, MCV, PLT  No results found for: CREATININE  No results found for: PSA  No results found for: TESTOSTERONE  No results found for: HGBA1C  Urinalysis    Component Value Date/Time   APPEARANCEUR Clear 11/11/2020 1609   GLUCOSEU Negative 11/11/2020 1609  BILIRUBINUR Negative 11/11/2020 1609   PROTEINUR Negative 11/11/2020 1609   NITRITE Negative 11/11/2020 1609   LEUKOCYTESUR Negative 11/11/2020 1609    Lab Results  Component Value Date   LABMICR See below: 11/11/2020   WBCUA None seen 11/11/2020   LABEPIT None seen 11/11/2020   BACTERIA None seen 11/11/2020    Pertinent Imaging: KUB yesterday: Images reviewed and dicussed with the patient Results for orders placed during the hospital encounter of 12/10/20  DG Abd 1 View  Narrative CLINICAL DATA:  Status post nephrolithotripsy.  EXAM: ABDOMEN - 1  VIEW  COMPARISON:  11/24/2020, 11/11/2020, CT 10/13/2020  FINDINGS: The 6 mm distal left ureteral calculus is unchanged in position near the expected location of the left ureterovesicular junction. No additional nephro or urolithiasis is clearly identified. Vascular calcifications are seen. Normal abdominal gas pattern.  IMPRESSION: Stable distal left ureteral 6 mm calculus.   Electronically Signed By: Helyn Numbers MD On: 12/10/2020 15:29  No results found for this or any previous visit.  No results found for this or any previous visit.  No results found for this or any previous visit.  No results found for this or any previous visit.  No results found for this or any previous visit.  No results found for this or any previous visit.  No results found for this or any previous visit.   Assessment & Plan:    1. Kidney stones -RTC 1 month with KUB - Urinalysis, Routine w reflex microscopic   No follow-ups on file.  Wilkie Aye, MD  [facility] [place]

## 2020-12-11 NOTE — Patient Instructions (Signed)

## 2020-12-25 ENCOUNTER — Telehealth: Payer: Self-pay

## 2020-12-25 ENCOUNTER — Other Ambulatory Visit: Payer: Medicare HMO

## 2020-12-25 ENCOUNTER — Other Ambulatory Visit: Payer: Self-pay

## 2020-12-25 DIAGNOSIS — N200 Calculus of kidney: Secondary | ICD-10-CM

## 2020-12-25 LAB — URINALYSIS, ROUTINE W REFLEX MICROSCOPIC
Bilirubin, UA: NEGATIVE
Glucose, UA: NEGATIVE
Nitrite, UA: NEGATIVE
Protein,UA: NEGATIVE
Specific Gravity, UA: 1.02 (ref 1.005–1.030)
Urobilinogen, Ur: 0.2 mg/dL (ref 0.2–1.0)
pH, UA: 5 (ref 5.0–7.5)

## 2020-12-25 LAB — MICROSCOPIC EXAMINATION
Epithelial Cells (non renal): NONE SEEN /hpf (ref 0–10)
Renal Epithel, UA: NONE SEEN /hpf

## 2020-12-25 MED ORDER — NITROFURANTOIN MONOHYD MACRO 100 MG PO CAPS: 100.0000 mg | ORAL_CAPSULE | Freq: Two times a day (BID) | ORAL | 0 refills | Status: DC

## 2020-12-25 NOTE — Telephone Encounter (Signed)
Macrobid bid sent to pharmacy per Dr. Ronne Binning. Patient called and made aware.

## 2020-12-29 LAB — URINE CULTURE

## 2021-01-07 ENCOUNTER — Ambulatory Visit (HOSPITAL_COMMUNITY)
Admission: RE | Admit: 2021-01-07 | Discharge: 2021-01-07 | Disposition: A | Discharge status: Home / Self Care | Payer: Medicare HMO | Source: Ambulatory Visit | Attending: Urology | Admitting: Urology

## 2021-01-07 DIAGNOSIS — N200 Calculus of kidney: Secondary | ICD-10-CM | POA: Insufficient documentation

## 2021-01-08 ENCOUNTER — Other Ambulatory Visit: Payer: Self-pay

## 2021-01-08 ENCOUNTER — Ambulatory Visit (INDEPENDENT_AMBULATORY_CARE_PROVIDER_SITE_OTHER): Payer: Medicare HMO | Admitting: Urology

## 2021-01-08 ENCOUNTER — Encounter: Payer: Self-pay | Admitting: Urology

## 2021-01-08 VITALS — BP 156/86 | HR 63 | Temp 98.6°F | Ht 76.0 in | Wt 212.0 lb

## 2021-01-08 DIAGNOSIS — N200 Calculus of kidney: Secondary | ICD-10-CM

## 2021-01-08 NOTE — Progress Notes (Signed)
Urological Symptom Review  Patient is experiencing the following symptoms: Urinary tract infection   Review of Systems  Gastrointestinal (upper)  : Negative for upper GI symptoms  Gastrointestinal (lower) : Negative for lower GI symptoms  Constitutional : Weight loss  Skin: Negative for skin symptoms  Eyes: Negative for eye symptoms  Ear/Nose/Throat : Sinus problems  Hematologic/Lymphatic: Negative for Hematologic/Lymphatic symptoms  Cardiovascular : Negative for cardiovascular symptoms  Respiratory : Negative for respiratory symptoms  Endocrine: Negative for endocrine symptoms  Musculoskeletal: Negative for musculoskeletal symptoms  Neurological: Negative for neurological symptoms  Psychologic: Anxiety

## 2021-01-08 NOTE — Progress Notes (Signed)
01/08/2021 2:22 PM   Cameron Chaney 30-Jan-1946 [id number]  Referring provider: Toma Deiters, MD [address] [place],  [place] [address]  followup after ESWL  HPI: Mr Martinek is a 74yo here for followup after left ESWL. He passed numerous fragments. No flank pain. No LUTS. KUB shows no residual calculi.    PMH: Past Medical History:  Diagnosis Date  . Anxiety   . Heartburn     Surgical History: Past Surgical History:  Procedure Laterality Date  . COLONOSCOPY    . EXTRACORPOREAL SHOCK WAVE LITHOTRIPSY Left 11/24/2020   Procedure: EXTRACORPOREAL SHOCK WAVE LITHOTRIPSY (ESWL);  Surgeon: Malen Gauze, MD;  Location: [facility];  Service: Urology;  Laterality: Left;    Home Medications:  Allergies as of 01/08/2021   No Known Allergies     Medication List       Accurate as of January 08, 2021  2:22 PM. If you have any questions, ask your nurse or doctor.        ALPRAZolam 0.5 MG tablet Commonly known as: XANAX Take 0.5 mg by mouth 3 (three) times daily.   aspirin 81 MG chewable tablet Chew 81 mg by mouth daily.   calcium carbonate 1500 (600 Ca) MG Tabs tablet Commonly known as: OSCAL Take 600 mg of elemental calcium by mouth 2 (two) times daily with a meal.   Fish Oil 1000 MG Caps Take 1,000 mg by mouth in the morning and at bedtime.   nitrofurantoin (macrocrystal-monohydrate) 100 MG capsule Commonly known as: MACROBID Take 1 capsule (100 mg total) by mouth every 12 (twelve) hours.   ondansetron 4 MG disintegrating tablet Commonly known as: Zofran ODT Take 1 tablet (4 mg total) by mouth every 8 (eight) hours as needed for nausea or vomiting.   oxyCODONE-acetaminophen 5-325 MG tablet Commonly known as: Percocet Take 1 tablet by mouth every 4 (four) hours as needed for severe pain.   pantoprazole 40 MG tablet Commonly known as: PROTONIX Take 40 mg by mouth daily as needed (acid reflux).   tamsulosin 0.4 MG Caps capsule Commonly known as:  FLOMAX Take 1 capsule (0.4 mg total) by mouth daily after supper.       Allergies: No Known Allergies  Family History: No family history on file.  Social History:  reports that he has been smoking. He has smoked for the past 20.00 years. He has never used smokeless tobacco. He reports previous alcohol use. He reports that he does not use drugs.  ROS: All other review of systems were reviewed and are negative except what is noted above in HPI  Physical Exam: BP (!) 156/86   Pulse 63   Temp 98.6 F (37 C)   Ht 6\' 4"  (1.93 m)   Wt 212 lb (96.2 kg)   BMI 25.81 kg/m   Constitutional:  Alert and oriented, No acute distress. HEENT:  AT, moist mucus membranes.  Trachea midline, no masses. Cardiovascular: No clubbing, cyanosis, or edema. Respiratory: Normal respiratory effort, no increased work of breathing. GI: Abdomen is soft, nontender, nondistended, no abdominal masses GU: No CVA tenderness.  Lymph: No cervical or inguinal lymphadenopathy. Skin: No rashes, bruises or suspicious lesions. Neurologic: Grossly intact, no focal deficits, moving all 4 extremities. Psychiatric: Normal mood and affect.  Laboratory Data: No results found for: WBC, HGB, HCT, MCV, PLT  No results found for: CREATININE  No results found for: PSA  No results found for: TESTOSTERONE  No results found for: HGBA1C  Urinalysis  Component Value Date/Time   APPEARANCEUR Clear 12/25/2020 1555   GLUCOSEU Negative 12/25/2020 1555   BILIRUBINUR Negative 12/25/2020 1555   PROTEINUR Negative 12/25/2020 1555   NITRITE Negative 12/25/2020 1555   LEUKOCYTESUR 1+ (A) 12/25/2020 1555    Lab Results  Component Value Date   LABMICR See below: 12/25/2020   WBCUA 6-10 (A) 12/25/2020   LABEPIT None seen 12/25/2020   MUCUS Present 12/25/2020   BACTERIA Few (A) 12/25/2020    Pertinent Imaging: KUB today: Images reviewed and discussed with the patient Results for orders placed during the hospital  encounter of 01/07/21  Abdomen 1 view (KUB)  Narrative CLINICAL DATA:  Nephrolithiasis.  Recent lithotripsy  EXAM: ABDOMEN - 1 VIEW  COMPARISON:  December 10, 2020; CT abdomen and pelvis October 13, 2020  FINDINGS: Previous calculus in the mid left pelvis is no longer evident. No new calcifications evident. Calcification in the right upper abdomen is likely of vascular etiology. There is moderate stool in the colon. There is no bowel dilatation or air-fluid level to suggest bowel obstruction. No free air. Lung bases clear.  IMPRESSION: The previously noted calculus in the mid left pelvis is no longer evident. No new calcifications evident. No bowel obstruction or free air evident. Lung bases clear.   Electronically Signed By: Bretta Bang III M.D. On: 01/08/2021 10:09  No results found for this or any previous visit.  No results found for this or any previous visit.  No results found for this or any previous visit.  No results found for this or any previous visit.  No results found for this or any previous visit.  No results found for this or any previous visit.  No results found for this or any previous visit.   Assessment & Plan:    1. Kidney stones -RTC 3 months with renal US and KUB   No follow-ups on file.  Wilkie Aye, MD  [facility] [place]

## 2021-01-08 NOTE — Patient Instructions (Signed)
Textbook of Natural Medicine (5th ed., pp. [phone]). [place], [place]: Elsevier.">  Dietary Guidelines to Help Prevent Kidney Stones Kidney stones are deposits of minerals and salts that form inside your kidneys. Your risk of developing kidney stones may be greater depending on your diet, your lifestyle, the medicines you take, and whether you have certain medical conditions. Most people can lower their chances of developing kidney stones by following the instructions below. Your dietitian may give you more specific instructions depending on your overall health and the type of kidney stones you tend to develop. What are tips for following this plan? Reading food labels  Choose foods with "no salt added" or "low-salt" labels. Limit your salt (sodium) intake to less than 1,500 mg a day.  Choose foods with calcium for each meal and snack. Try to eat about 300 mg of calcium at each meal. Foods that contain 200-500 mg of calcium a serving include: ? 8 oz (237 mL) of milk, calcium-fortifiednon-dairy milk, and calcium-fortifiedfruit juice. Calcium-fortified means that calcium has been added to these drinks. ? 8 oz (237 mL) of kefir, yogurt, and soy yogurt. ? 4 oz (114 g) of tofu. ? 1 oz (28 g) of cheese. ? 1 cup (150 g) of dried figs. ? 1 cup (91 g) of cooked broccoli. ? One 3 oz (85 g) can of sardines or mackerel. Most people need 1,000-1,500 mg of calcium a day. Talk to your dietitian about how much calcium is recommended for you.   Shopping  Buy plenty of fresh fruits and vegetables. Most people do not need to avoid fruits and vegetables, even if these foods contain nutrients that may contribute to kidney stones.  When shopping for convenience foods, choose: ? Whole pieces of fruit. ? Pre-made salads with dressing on the side. ? Low-fat fruit and yogurt smoothies.  Avoid buying frozen meals or prepared deli foods. These can be high in sodium.  Look for foods with live cultures, such as  yogurt and kefir.  Choose high-fiber grains, such as whole-wheat breads, oat bran, and wheat cereals. Cooking  Do not add salt to food when cooking. Place a salt shaker on the table and allow each person to add his or her own salt to taste.  Use vegetable protein, such as beans, textured vegetable protein (TVP), or tofu, instead of meat in pasta, casseroles, and soups. Meal planning  Eat less salt, if told by your dietitian. To do this: ? Avoid eating processed or pre-made food. ? Avoid eating fast food.  Eat less animal protein, including cheese, meat, poultry, or fish, if told by your dietitian. To do this: ? Limit the number of times you have meat, poultry, fish, or cheese each week. Eat a diet free of meat at least 2 days a week. ? Eat only one serving each day of meat, poultry, fish, or seafood. ? When you prepare animal protein, cut pieces into small portion sizes. For most meat and fish, one serving is about the size of the palm of your hand.  Eat at least five servings of fresh fruits and vegetables each day. To do this: ? Keep fruits and vegetables on hand for snacks. ? Eat one piece of fruit or a handful of berries with breakfast. ? Have a salad and fruit at lunch. ? Have two kinds of vegetables at dinner.  Limit foods that are high in a substance called oxalate. These include: ? Spinach (cooked), rhubarb, beets, sweet potatoes, and Swiss chard. ? Peanuts. ?  Potato chips, french fries, and baked potatoes with skin on. ? Nuts and nut products. ? Chocolate.  If you regularly take a diuretic medicine, make sure to eat at least 1 or 2 servings of fruits or vegetables that are high in potassium each day. These include: ? Avocado. ? Banana. ? Orange, prune, carrot, or tomato juice. ? Baked potato. ? Cabbage. ? Beans and split peas. Lifestyle  Drink enough fluid to keep your urine pale yellow. This is the most important thing you can do. Spread your fluid intake throughout  the day.  If you drink alcohol: ? Limit how much you use to:  0-1 drink a day for women who are not pregnant.  0-2 drinks a day for men. ? Be aware of how much alcohol is in your drink. In the [place] one drink equals one 12 oz bottle of beer (355 mL), one 5 oz glass of wine (148 mL), or one 1 oz glass of hard liquor (44 mL).  Lose weight if told by your health care provider. Work with your dietitian to find an eating plan and weight loss strategies that work best for you.   General information  Talk to your health care provider and dietitian about taking daily supplements. You may be told the following depending on your health and the cause of your kidney stones: ? Not to take supplements with vitamin C. ? To take a calcium supplement. ? To take a daily probiotic supplement. ? To take other supplements such as magnesium, fish oil, or vitamin B6.  Take over-the-counter and prescription medicines only as told by your health care provider. These include supplements. What foods should I limit? Limit your intake of the following foods, or eat them as told by your dietitian. Vegetables Spinach. Rhubarb. Beets. Canned vegetables. [place]ita Fire. Olives. Baked potatoes with skin. Grains Wheat bran. Baked goods. Salted crackers. Cereals high in sugar. Meats and other proteins Nuts. Nut butters. Large portions of meat, poultry, or fish. Salted, precooked, or cured meats, such as sausages, meat loaves, and hot dogs. Dairy Cheese. Beverages Regular soft drinks. Regular vegetable juice. Seasonings and condiments Seasoning blends with salt. Salad dressings. Soy sauce. Ketchup. Barbecue sauce. Other foods Canned soups. Canned pasta sauce. Casseroles. Pizza. Lasagna. Frozen meals. Potato chips. [place] fries. The items listed above may not be a complete list of foods and beverages you should limit. Contact a dietitian for more information. What foods should I avoid? Talk to your dietitian about  specific foods you should avoid based on the type of kidney stones you have and your overall health. Fruits Grapefruit. The item listed above may not be a complete list of foods and beverages you should avoid. Contact a dietitian for more information. Summary  Kidney stones are deposits of minerals and salts that form inside your kidneys.  You can lower your risk of kidney stones by making changes to your diet.  The most important thing you can do is drink enough fluid. Drink enough fluid to keep your urine pale yellow.  Talk to your dietitian about how much calcium you should have each day, and eat less salt and animal protein as told by your dietitian. This information is not intended to replace advice given to you by your health care provider. Make sure you discuss any questions you have with your health care provider. Document Revised: 10/10/2019 Document Reviewed: 10/10/2019 Elsevier Patient Education  2021 [employer].

## 2021-01-12 ENCOUNTER — Encounter: Payer: Self-pay | Admitting: Urology

## 2021-04-15 ENCOUNTER — Other Ambulatory Visit: Payer: Self-pay

## 2021-04-15 ENCOUNTER — Ambulatory Visit (HOSPITAL_COMMUNITY)
Admission: RE | Admit: 2021-04-15 | Discharge: 2021-04-15 | Disposition: A | Discharge status: Home / Self Care | Payer: Medicare HMO | Source: Ambulatory Visit | Attending: Urology | Admitting: Urology

## 2021-04-15 DIAGNOSIS — N200 Calculus of kidney: Secondary | ICD-10-CM

## 2021-04-16 ENCOUNTER — Telehealth (INDEPENDENT_AMBULATORY_CARE_PROVIDER_SITE_OTHER): Payer: Medicare HMO | Admitting: Urology

## 2021-04-16 ENCOUNTER — Encounter: Payer: Self-pay | Admitting: Urology

## 2021-04-16 DIAGNOSIS — N200 Calculus of kidney: Secondary | ICD-10-CM

## 2021-04-16 NOTE — Patient Instructions (Signed)
Textbook of Natural Medicine (5th ed., pp. [phone]). [place], [place]: Elsevier.">  Dietary Guidelines to Help Prevent Kidney Stones Kidney stones are deposits of minerals and salts that form inside your kidneys. Your risk of developing kidney stones may be greater depending on your diet, your lifestyle, the medicines you take, and whether you have certain medical conditions. Most people can lower their chances of developing kidney stones by following the instructions below. Your dietitian may give you more specific instructions depending on your overall health and the type of kidney stones youtend to develop. What are tips for following this plan? Reading food labels  Choose foods with "no salt added" or "low-salt" labels. Limit your salt (sodium) intake to less than 1,500 mg a day. Choose foods with calcium for each meal and snack. Try to eat about 300 mg of calcium at each meal. Foods that contain 200-500 mg of calcium a serving include: 8 oz (237 mL) of milk, calcium-fortifiednon-dairy milk, and calcium-fortifiedfruit juice. Calcium-fortified means that calcium has been added to these drinks. 8 oz (237 mL) of kefir, yogurt, and soy yogurt. 4 oz (114 g) of tofu. 1 oz (28 g) of cheese. 1 cup (150 g) of dried figs. 1 cup (91 g) of cooked broccoli. One 3 oz (85 g) can of sardines or mackerel. Most people need 1,000-1,500 mg of calcium a day. Talk to your dietitian abouthow much calcium is recommended for you. Shopping Buy plenty of fresh fruits and vegetables. Most people do not need to avoid fruits and vegetables, even if these foods contain nutrients that may contribute to kidney stones. When shopping for convenience foods, choose: Whole pieces of fruit. Pre-made salads with dressing on the side. Low-fat fruit and yogurt smoothies. Avoid buying frozen meals or prepared deli foods. These can be high in sodium. Look for foods with live cultures, such as yogurt and kefir. Choose high-fiber  grains, such as whole-wheat breads, oat bran, and wheat cereals. Cooking Do not add salt to food when cooking. Place a salt shaker on the table and allow each person to add his or her own salt to taste. Use vegetable protein, such as beans, textured vegetable protein (TVP), or tofu, instead of meat in pasta, casseroles, and soups. Meal planning Eat less salt, if told by your dietitian. To do this: Avoid eating processed or pre-made food. Avoid eating fast food. Eat less animal protein, including cheese, meat, poultry, or fish, if told by your dietitian. To do this: Limit the number of times you have meat, poultry, fish, or cheese each week. Eat a diet free of meat at least 2 days a week. Eat only one serving each day of meat, poultry, fish, or seafood. When you prepare animal protein, cut pieces into small portion sizes. For most meat and fish, one serving is about the size of the palm of your hand. Eat at least five servings of fresh fruits and vegetables each day. To do this: Keep fruits and vegetables on hand for snacks. Eat one piece of fruit or a handful of berries with breakfast. Have a salad and fruit at lunch. Have two kinds of vegetables at dinner. Limit foods that are high in a substance called oxalate. These include: Spinach (cooked), rhubarb, beets, sweet potatoes, and Swiss chard. Peanuts. Potato chips, french fries, and baked potatoes with skin on. Nuts and nut products. Chocolate. If you regularly take a diuretic medicine, make sure to eat at least 1 or 2 servings of fruits or vegetables that are   high in potassium each day. These include: Avocado. Banana. Orange, prune, carrot, or tomato juice. Baked potato. Cabbage. Beans and split peas. Lifestyle  Drink enough fluid to keep your urine pale yellow. This is the most important thing you can do. Spread your fluid intake throughout the day. If you drink alcohol: Limit how much you use to: 0-1 drink a day for women who  are not pregnant. 0-2 drinks a day for men. Be aware of how much alcohol is in your drink. In the [place] one drink equals one 12 oz bottle of beer (355 mL), one 5 oz glass of wine (148 mL), or one 1 oz glass of hard liquor (44 mL). Lose weight if told by your health care provider. Work with your dietitian to find an eating plan and weight loss strategies that work best for you.  General information Talk to your health care provider and dietitian about taking daily supplements. You may be told the following depending on your health and the cause of your kidney stones: Not to take supplements with vitamin C. To take a calcium supplement. To take a daily probiotic supplement. To take other supplements such as magnesium, fish oil, or vitamin B6. Take over-the-counter and prescription medicines only as told by your health care provider. These include supplements. What foods should I limit? Limit your intake of the following foods, or eat them as told by your dietitian. Vegetables Spinach. Rhubarb. Beets. Canned vegetables. Pickles. Olives. Baked potatoeswith skin. Grains Wheat bran. Baked goods. Salted crackers. Cereals high in sugar. Meats and other proteins Nuts. Nut butters. Large portions of meat, poultry, or fish. Salted, precooked,or cured meats, such as sausages, meat loaves, and hot dogs. Dairy Cheese. Beverages Regular soft drinks. Regular vegetable juice. Seasonings and condiments Seasoning blends with salt. Salad dressings. Soy sauce. Ketchup. Barbecue sauce. Other foods Canned soups. Canned pasta sauce. Casseroles. Pizza. Lasagna. Frozen meals.Potato chips. French fries. The items listed above may not be a complete list of foods and beverages you should limit. Contact a dietitian for more information. What foods should I avoid? Talk to your dietitian about specific foods you should avoid based on the typeof kidney stones you have and your overall health. Fruits Grapefruit. The  item listed above may not be a complete list of foods and beverages you should avoid. Contact a dietitian for more information. Summary Kidney stones are deposits of minerals and salts that form inside your kidneys. You can lower your risk of kidney stones by making changes to your diet. The most important thing you can do is drink enough fluid. Drink enough fluid to keep your urine pale yellow. Talk to your dietitian about how much calcium you should have each day, and eat less salt and animal protein as told by your dietitian. This information is not intended to replace advice given to you by your health care provider. Make sure you discuss any questions you have with your healthcare provider. Document Revised: 10/10/2019 Document Reviewed: 10/10/2019 Elsevier Patient Education  2022 [employer] Inc[employer]  

## 2021-04-16 NOTE — Progress Notes (Signed)
04/16/2021 1:36 PM   Cameron Chaney 12-24-1945 [id number]  Referring provider: Toma Deiters, MD [address] [place],  [place] [address]  Patient location: home Physician location: office I connected with  [name] on 04/16/21 by a video enabled telemedicine application and verified that I am speaking with the correct person using two identifiers.   I discussed the limitations of evaluation and management by telemedicine. The patient expressed understanding and agreed to proceed.   Nephrolithiasis  HPI: [name] is a 75yo here for followup for nephrolithiasis. No stone events since last visit. Renal US shows possible left lower pole calculus which is not seen on KUB. NO flank pain or significant LUTS.    PMH: Past Medical History:  Diagnosis Date   Anxiety    Heartburn     Surgical History: Past Surgical History:  Procedure Laterality Date   COLONOSCOPY     EXTRACORPOREAL SHOCK WAVE LITHOTRIPSY Left 11/24/2020   Procedure: EXTRACORPOREAL SHOCK WAVE LITHOTRIPSY (ESWL);  Surgeon: Malen Gauze, MD;  Location: [facility];  Service: Urology;  Laterality: Left;    Home Medications:  Allergies as of 04/16/2021   No Known Allergies      Medication List        Accurate as of April 16, 2021  1:36 PM. If you have any questions, ask your nurse or doctor.          ALPRAZolam 0.5 MG tablet Commonly known as: XANAX Take 0.5 mg by mouth 3 (three) times daily.   aspirin 81 MG chewable tablet Chew 81 mg by mouth daily.   calcium carbonate 1500 (600 Ca) MG Tabs tablet Commonly known as: OSCAL Take 600 mg of elemental calcium by mouth 2 (two) times daily with a meal.   Fish Oil 1000 MG Caps Take 1,000 mg by mouth in the morning and at bedtime.   nitrofurantoin (macrocrystal-monohydrate) 100 MG capsule Commonly known as: MACROBID Take 1 capsule (100 mg total) by mouth every 12 (twelve) hours.   ondansetron 4 MG disintegrating tablet Commonly known as:  Zofran ODT Take 1 tablet (4 mg total) by mouth every 8 (eight) hours as needed for nausea or vomiting.   oxyCODONE-acetaminophen 5-325 MG tablet Commonly known as: Percocet Take 1 tablet by mouth every 4 (four) hours as needed for severe pain.   pantoprazole 40 MG tablet Commonly known as: PROTONIX Take 40 mg by mouth daily as needed (acid reflux).   tamsulosin 0.4 MG Caps capsule Commonly known as: FLOMAX Take 1 capsule (0.4 mg total) by mouth daily after supper.        Allergies: No Known Allergies  Family History: No family history on file.  Social History:  reports that he has been smoking. He has never used smokeless tobacco. He reports previous alcohol use. He reports that he does not use drugs.  ROS: All other review of systems were reviewed and are negative except what is noted above in HPI   Laboratory Data: No results found for: WBC, HGB, HCT, MCV, PLT  No results found for: CREATININE  No results found for: PSA  No results found for: TESTOSTERONE  No results found for: HGBA1C  Urinalysis    Component Value Date/Time   APPEARANCEUR Clear 12/25/2020 1555   GLUCOSEU Negative 12/25/2020 1555   BILIRUBINUR Negative 12/25/2020 1555   PROTEINUR Negative 12/25/2020 1555   NITRITE Negative 12/25/2020 1555   LEUKOCYTESUR 1+ (A) 12/25/2020 1555    Lab Results  Component Value  Date   LABMICR See below: 12/25/2020   WBCUA 6-10 (A) 12/25/2020   LABEPIT None seen 12/25/2020   MUCUS Present 12/25/2020   BACTERIA Few (A) 12/25/2020    Pertinent Imaging: Renal US and KUB 6/126/2022: Images reviewed and discussed with the patient Results for orders placed during the hospital encounter of 04/15/21  Abdomen 1 view (KUB)  Addendum 04/16/2021 10:10 AM ADDENDUM REPORT: 04/16/2021 10:08  ADDENDUM: The 9 mm calculus seen by ultrasound in left kidney region is not appreciable by radiography.   Electronically Signed By: Bretta Bang III M.D. On:  04/16/2021 10:08  Narrative CLINICAL DATA:  Left flank pain  EXAM: ABDOMEN - 1 VIEW  COMPARISON:  January 07, 2021  FINDINGS: Moderate stool in colon. No bowel dilatation or air-fluid level to suggest bowel obstruction. No free air. Apparent vascular calcifications in the right upper abdomen and pelvis. Lung bases clear.  IMPRESSION: Scattered apparent vascular calcifications. No bowel obstruction or free air evident on supine examination. Lung bases clear. Moderate stool in colon.  Electronically Signed: By: Bretta Bang III M.D. On: 04/16/2021 10:03  No results found for this or any previous visit.  No results found for this or any previous visit.  No results found for this or any previous visit.  Results for orders placed during the hospital encounter of 04/15/21  Ultrasound renal complete  Narrative CLINICAL DATA:  Nephrolithiasis  EXAM: RENAL / URINARY TRACT ULTRASOUND COMPLETE  COMPARISON:  February 08, 2021 renal ultrasound  FINDINGS: Right Kidney:  Renal measurements: 11.0 x 5.5 x 6.2 cm = volume: 195.5 mL. Echogenicity and renal cortical thickness are within normal limits. No mass, perinephric fluid, or hydronephrosis visualized. No sonographically demonstrable calculus or ureterectasis.  Left Kidney:  Renal measurements: 11.9 x 5.9 x 5.1 cm = volume: 188.3 mL. Echogenicity and renal cortical thickness are within normal limits. No perinephric fluid or hydronephrosis visualized. There is a cyst in the lower pole left kidney region measuring 1.8 x 1.5 x 1.5 cm with an immediately adjacent 9 mm calculus. A cysts in the mid left kidney measures 1.7 x 1.4 x 1.7 cm. No ureterectasis.  Bladder:  Appears normal for degree of bladder distention.  Other:  None.  IMPRESSION: 1. Cysts in left kidney. There is a 9 mm calculus immediately adjacent to a cyst in the lower pole region. No obstructing calculus evident on either side.  2.  Right kidney  appears normal.  3.  No urinary bladder lesions evident.   Electronically Signed By: Bretta Bang III M.D. On: 04/16/2021 10:07  No results found for this or any previous visit.  No results found for this or any previous visit.  No results found for this or any previous visit.   Assessment & Plan:    1. Kidney stones -RCT 1 year with KUB   No follow-ups on file.  Wilkie Aye, MD  [facility] [place]

## 2022-08-07 IMAGING — DX DG ABDOMEN 1V
2 series · 2 of 2 positions shown · non-contrast
Comparison: 11/24/2020, 11/11/2020, CT 10/13/2020

CLINICAL DATA: Status post nephrolithotripsy.

EXAM:
ABDOMEN - 1 VIEW

[abdomen kub (1 of 2)]
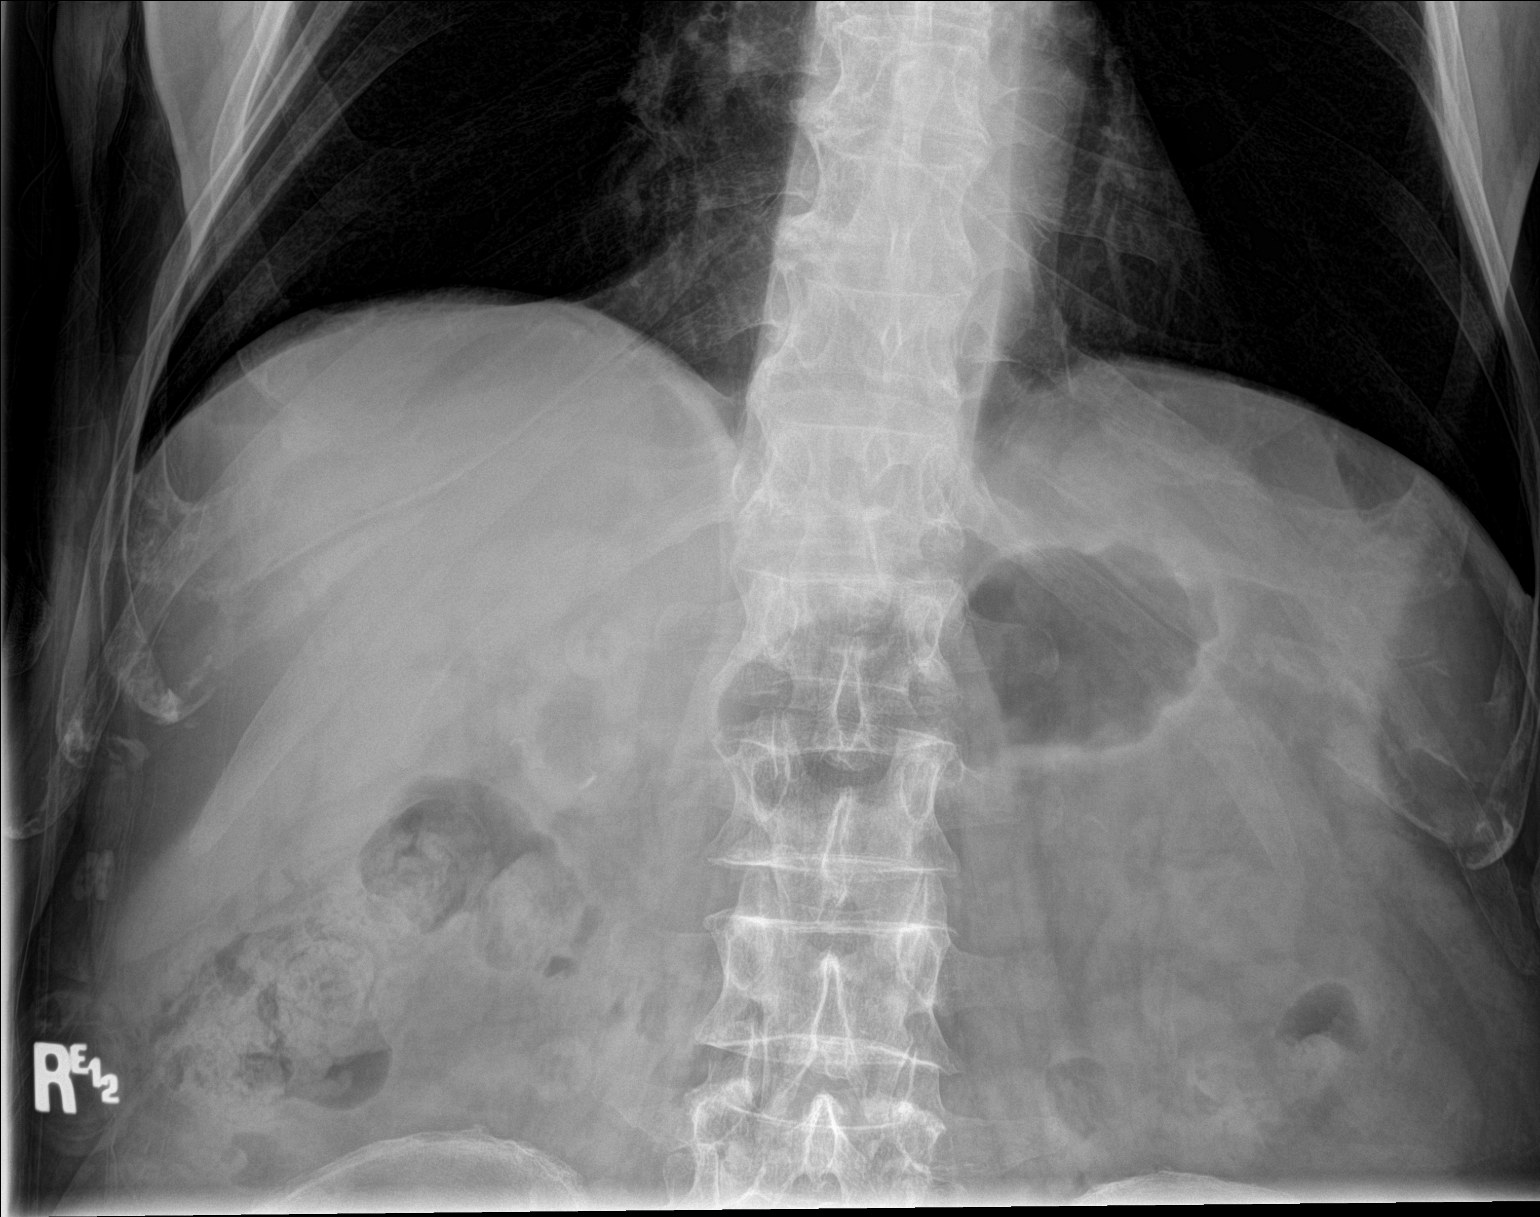

[abdomen kub (2 of 2)]
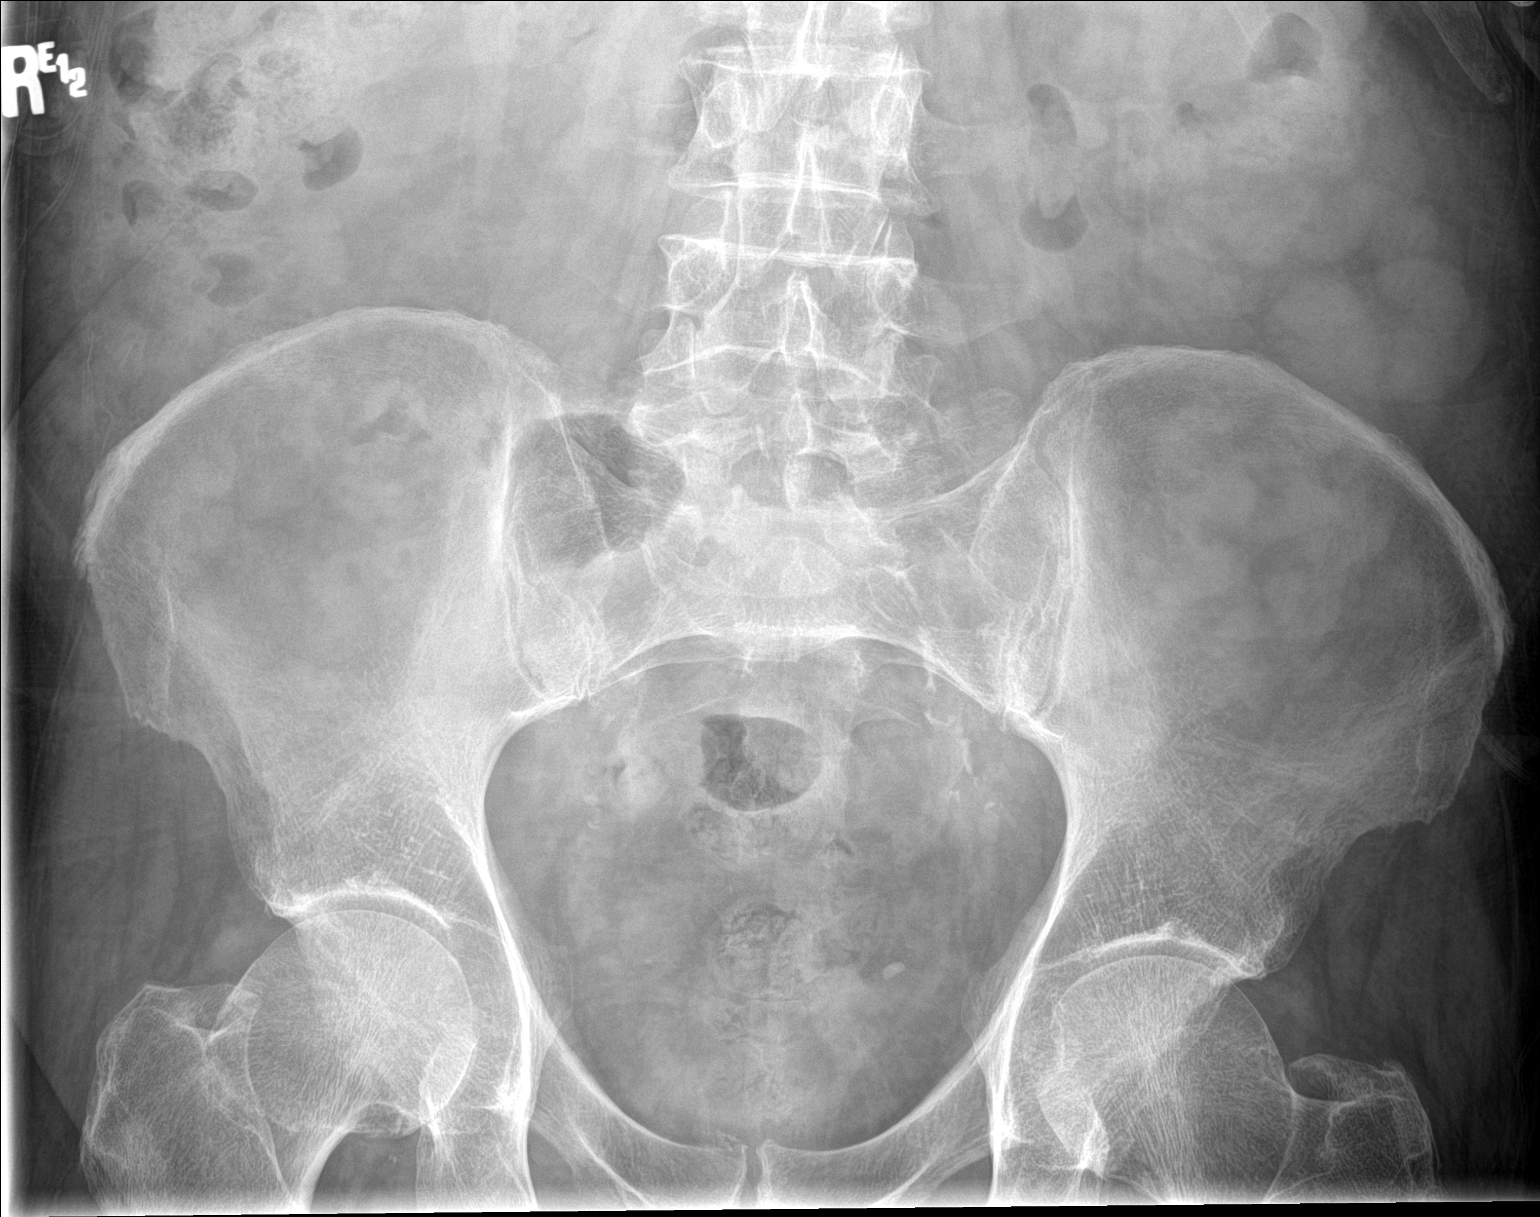

[2 of 2 positions shown; findings below may reference images not displayed]

FINDINGS: The 6 mm distal left ureteral calculus is unchanged in position near
the expected location of the left ureterovesicular junction. No
additional nephro or urolithiasis is clearly identified. Vascular
calcifications are seen. Normal abdominal gas pattern.
IMPRESSION: Stable distal left ureteral 6 mm calculus.

## 2022-12-11 IMAGING — US US RENAL
1 series · 14 of 25 positions shown · non-contrast
Comparison: February 08, 2021 renal ultrasound

CLINICAL DATA: Nephrolithiasis

EXAM:
RENAL / URINARY TRACT ULTRASOUND COMPLETE

[Series 1: us renal · 14 of 87 slices shown]
[im 1/87]
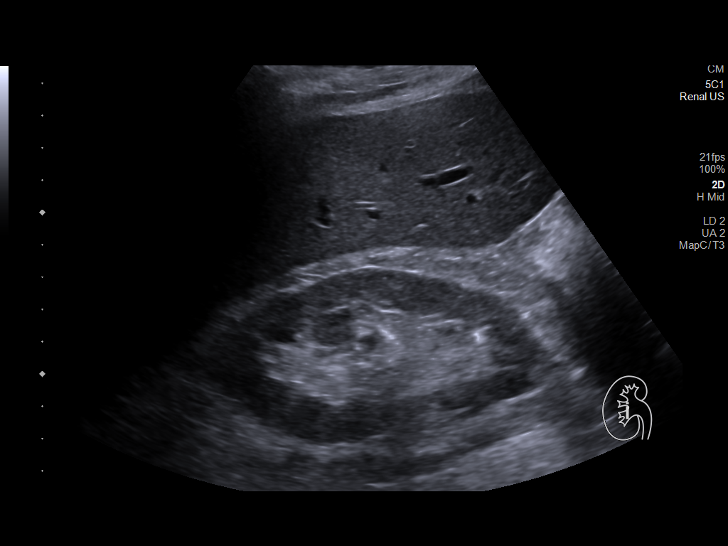
[im 8/87]
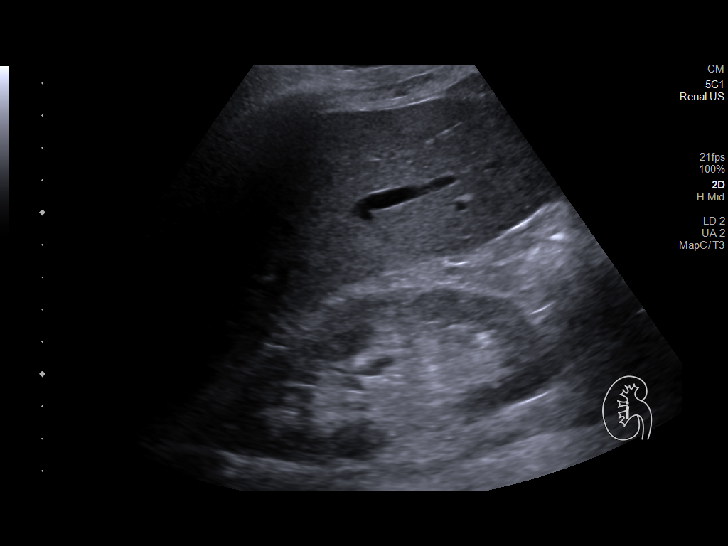
[im 15/87]
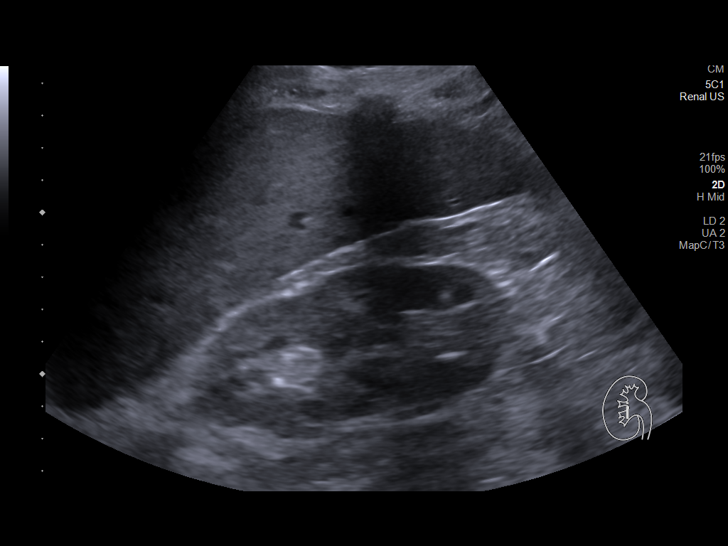
[im 22/87]
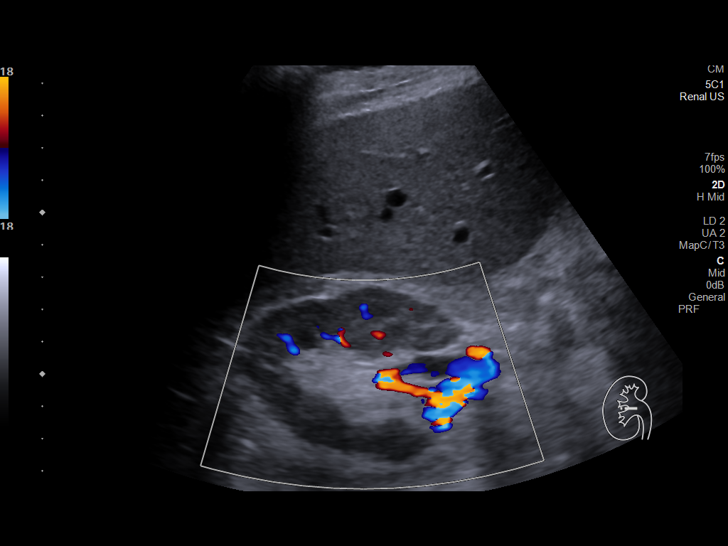
[im 29/87]
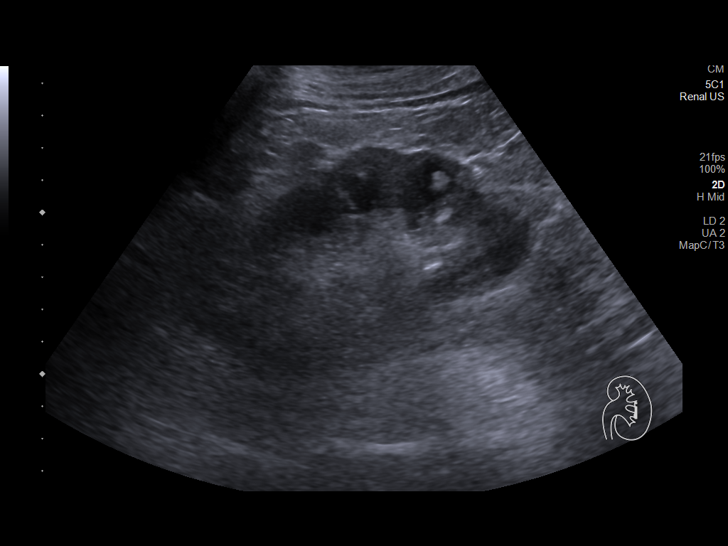
[im 33/87]
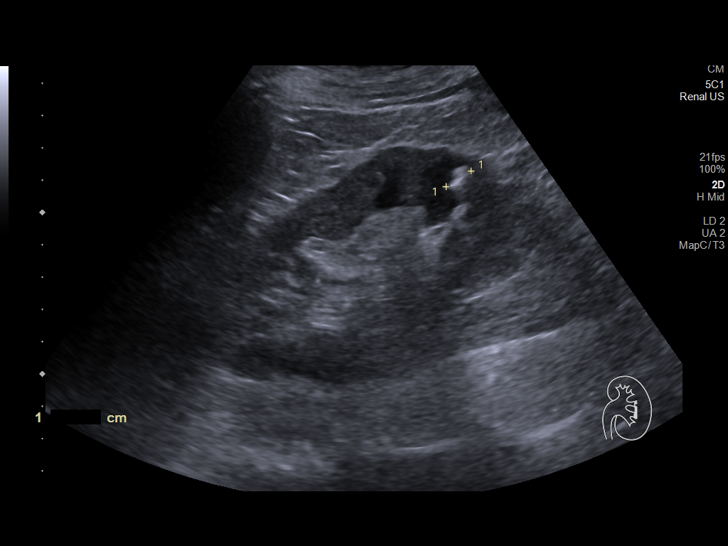
[im 40/87]
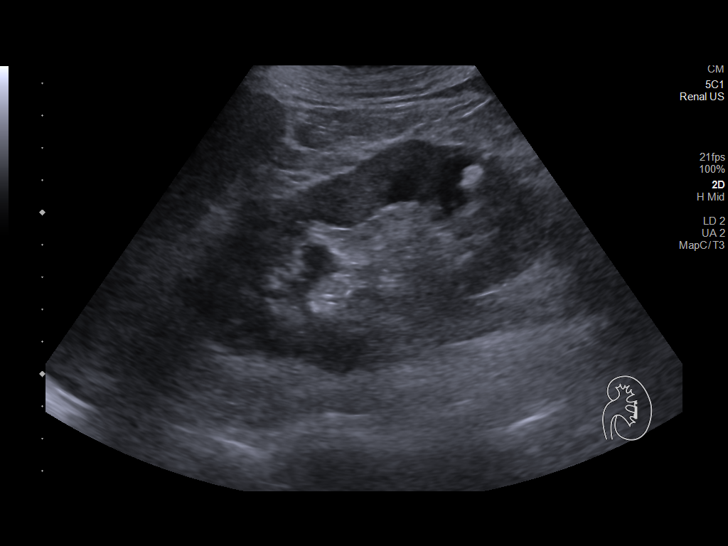
[im 47/87]
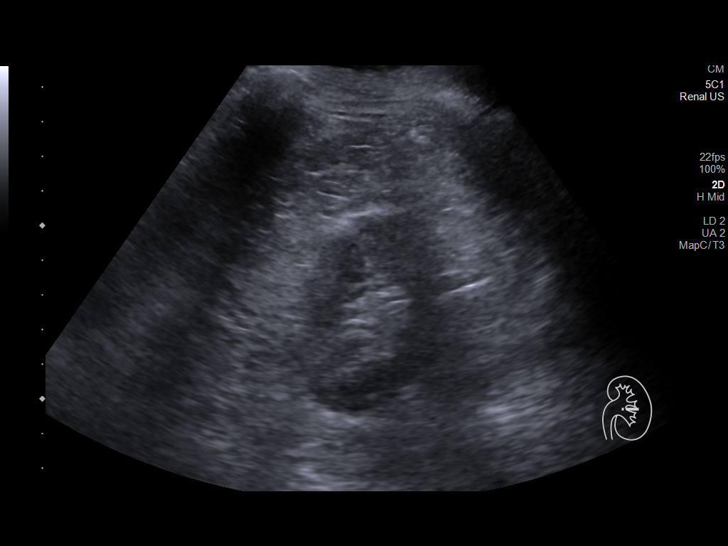
[im 54/87]
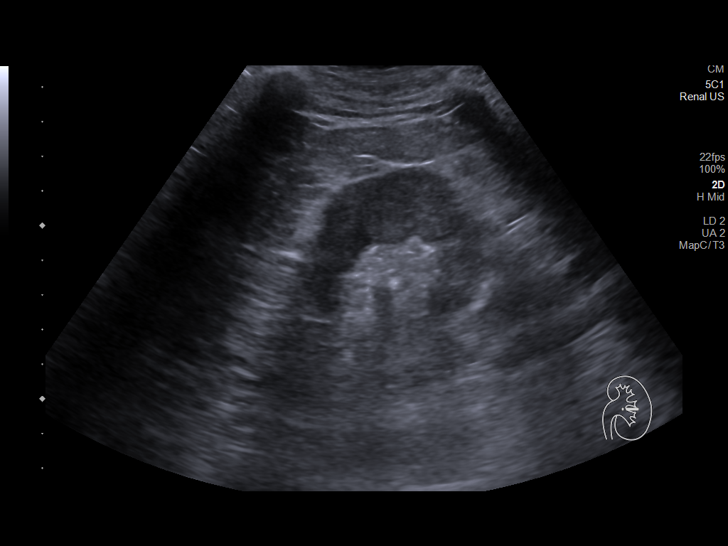
[im 58/87]
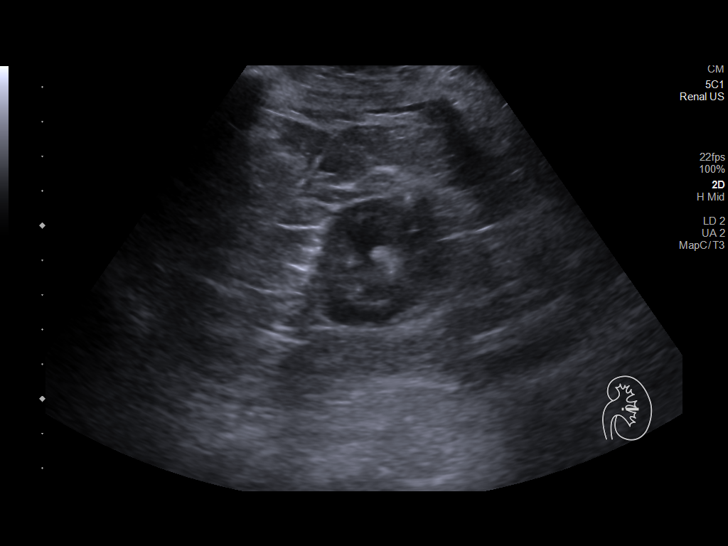
[im 65/87]
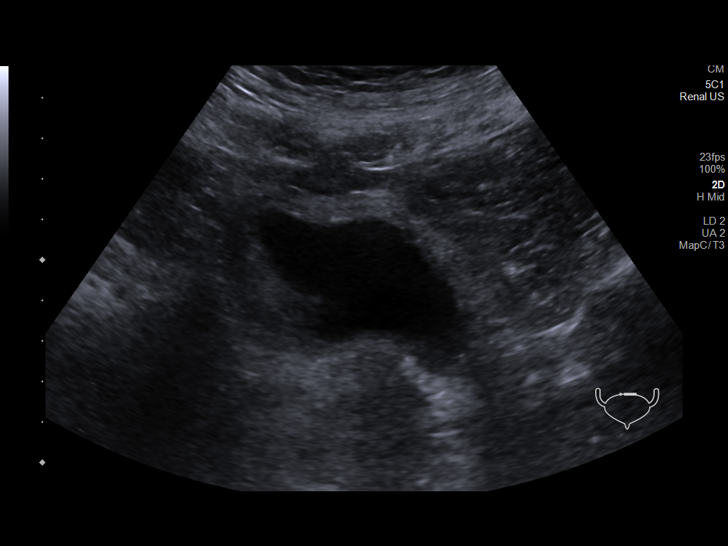
[im 72/87]
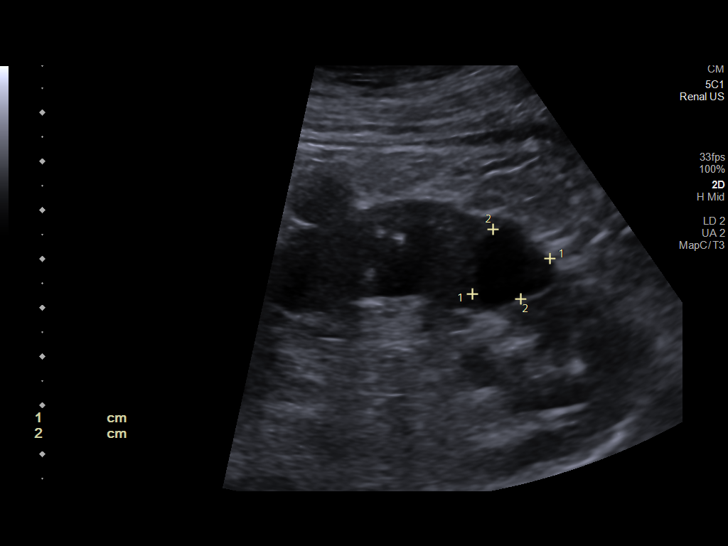
[im 79/87]
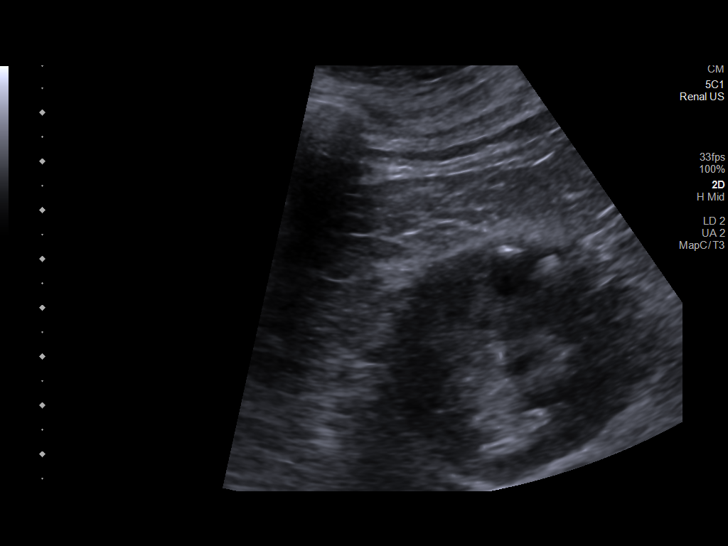
[im 87/87]
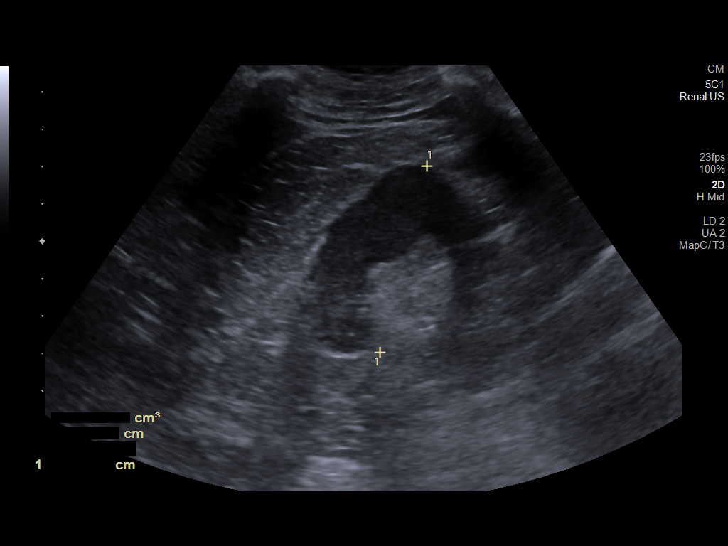

[14 of 25 positions shown; findings below may reference images not displayed]

FINDINGS: Right Kidney:

Renal measurements: 11.0 x 5.5 x 6.2 cm = volume: 195.5 mL.
Echogenicity and renal cortical thickness are within normal limits.
No mass, perinephric fluid, or hydronephrosis visualized. No
sonographically demonstrable calculus or ureterectasis.

Left Kidney:

Renal measurements: 11.9 x 5.9 x 5.1 cm = volume: 188.3 mL.
Echogenicity and renal cortical thickness are within normal limits.
No perinephric fluid or hydronephrosis visualized. There is a cyst
in the lower pole left kidney region measuring 1.8 x 1.5 x 1.5 cm
with an immediately adjacent 9 mm calculus. A cysts in the mid left
kidney measures 1.7 x 1.4 x 1.7 cm. No ureterectasis.

Bladder:

Appears normal for degree of bladder distention.

Other:

None.
IMPRESSION: 1. Cysts in left kidney. There is a 9 mm calculus immediately
adjacent to a cyst in the lower pole region. No obstructing calculus
evident on either side.

2.  Right kidney appears normal.

3.  No urinary bladder lesions evident.

## 2024-03-06 ENCOUNTER — Ambulatory Visit (HOSPITAL_COMMUNITY)
Admission: RE | Admit: 2024-03-06 | Discharge: 2024-03-06 | Disposition: A | Discharge status: Home / Self Care | Source: Ambulatory Visit | Attending: Urology | Admitting: Urology

## 2024-03-06 ENCOUNTER — Ambulatory Visit: Admitting: Urology

## 2024-03-06 ENCOUNTER — Encounter: Payer: Self-pay | Admitting: Urology

## 2024-03-06 VITALS — BP 156/81 | HR 73

## 2024-03-06 DIAGNOSIS — N411 Chronic prostatitis: Secondary | ICD-10-CM | POA: Diagnosis not present

## 2024-03-06 DIAGNOSIS — N401 Enlarged prostate with lower urinary tract symptoms: Secondary | ICD-10-CM

## 2024-03-06 DIAGNOSIS — R350 Frequency of micturition: Secondary | ICD-10-CM

## 2024-03-06 DIAGNOSIS — N200 Calculus of kidney: Secondary | ICD-10-CM | POA: Insufficient documentation

## 2024-03-06 DIAGNOSIS — N138 Other obstructive and reflux uropathy: Secondary | ICD-10-CM

## 2024-03-06 LAB — URINALYSIS, ROUTINE W REFLEX MICROSCOPIC
Bilirubin, UA: NEGATIVE
Glucose, UA: NEGATIVE
Nitrite, UA: NEGATIVE
Protein,UA: NEGATIVE
RBC, UA: NEGATIVE
Specific Gravity, UA: 1.025 (ref 1.005–1.030)
Urobilinogen, Ur: 2 mg/dL — ABNORMAL HIGH (ref 0.2–1.0)
pH, UA: 6 (ref 5.0–7.5)

## 2024-03-06 LAB — BLADDER SCAN AMB NON-IMAGING: Scan Result: 0

## 2024-03-06 LAB — MICROSCOPIC EXAMINATION
Bacteria, UA: NONE SEEN
RBC, Urine: NONE SEEN /HPF (ref 0–2)

## 2024-03-06 MED ORDER — ALFUZOSIN HCL ER 10 MG PO TB24: 10.0000 mg | ORAL_TABLET | Freq: Every day | ORAL | 11 refills | Status: AC

## 2024-03-06 MED ORDER — SULFAMETHOXAZOLE-TRIMETHOPRIM 800-160 MG PO TABS: 1.0000 | ORAL_TABLET | Freq: Two times a day (BID) | ORAL | 0 refills | Status: DC

## 2024-03-06 NOTE — Progress Notes (Signed)
 03/06/2024 1:48 PM   Cameron Chaney 21-Jan-1946 [id number]  Referring provider: Veda Gerald, MD [address] [place],  [place] [address]  Urinary frequency   HPI: Cameron Chaney is a 77yo here for evaluation of BPh with urinary frequency. For the past 18 months he has noted increased urinary frequency and nocturia. IPSS 24 QOL 5. Over the past month he has noted increased urinary urgency, a weaker urinary stream, and intermittent dysuria. Nocturia worsened from 1x to 4x. Patient is not currently on any BPH meds.  No stone events since last visit. No imaging was 3 years ago.    PMH: Past Medical History:  Diagnosis Date   Anxiety    Heartburn     Surgical History: Past Surgical History:  Procedure Laterality Date   COLONOSCOPY     EXTRACORPOREAL SHOCK WAVE LITHOTRIPSY Left 11/24/2020   Procedure: EXTRACORPOREAL SHOCK WAVE LITHOTRIPSY (ESWL);  Surgeon: Marco Severs, MD;  Location: [facility];  Service: Urology;  Laterality: Left;    Home Medications:  Allergies as of 03/06/2024   No Known Allergies      Medication List        Accurate as of Mar 06, 2024  1:48 PM. If you have any questions, ask your nurse or doctor.          ALPRAZolam 0.5 MG tablet Commonly known as: XANAX Take 0.5 mg by mouth 3 (three) times daily.   aspirin 81 MG chewable tablet Chew 81 mg by mouth daily.   calcium carbonate 1500 (600 Ca) MG Tabs tablet Commonly known as: OSCAL Take 600 mg of elemental calcium by mouth 2 (two) times daily with a meal.   Fish Oil 1000 MG Caps Take 1,000 mg by mouth in the morning and at bedtime.   nitrofurantoin  (macrocrystal-monohydrate) 100 MG capsule Commonly known as: MACROBID  Take 1 capsule (100 mg total) by mouth every 12 (twelve) hours.   ondansetron  4 MG disintegrating tablet Commonly known as: Zofran  ODT Take 1 tablet (4 mg total) by mouth every 8 (eight) hours as needed for nausea or vomiting.   pantoprazole 40 MG tablet Commonly known  as: PROTONIX Take 40 mg by mouth daily as needed (acid reflux).   tamsulosin  0.4 MG Caps capsule Commonly known as: FLOMAX  Take 1 capsule (0.4 mg total) by mouth daily after supper.        Allergies: No Known Allergies  Family History: History reviewed. No pertinent family history.  Social History:  reports that he has been smoking cigarettes. He has never used smokeless tobacco. He reports that he does not currently use alcohol. He reports that he does not use drugs.  ROS: All other review of systems were reviewed and are negative except what is noted above in HPI  Physical Exam: BP (!) 156/81   Pulse 73   Constitutional:  Alert and oriented, No acute distress. HEENT: [place] AT, moist mucus membranes.  Trachea midline, no masses. Cardiovascular: No clubbing, cyanosis, or edema. Respiratory: Normal respiratory effort, no increased work of breathing. GI: Abdomen is soft, nontender, nondistended, no abdominal masses GU: No CVA tenderness.  Lymph: No cervical or inguinal lymphadenopathy. Skin: No rashes, bruises or suspicious lesions. Neurologic: Grossly intact, no focal deficits, moving all 4 extremities. Psychiatric: Normal mood and affect.  Laboratory Data: No results found for: "WBC", "HGB", "HCT", "MCV", "PLT"  No results found for: "CREATININE"  No results found for: "PSA"  No results found for: "TESTOSTERONE"  No results found for: "HGBA1C"  Urinalysis    Component Value Date/Time   APPEARANCEUR Clear 12/25/2020 1555   GLUCOSEU Negative 12/25/2020 1555   BILIRUBINUR Negative 12/25/2020 1555   PROTEINUR Negative 12/25/2020 1555   NITRITE Negative 12/25/2020 1555   LEUKOCYTESUR 1+ (A) 12/25/2020 1555    Lab Results  Component Value Date   LABMICR See below: 12/25/2020   WBCUA 6-10 (A) 12/25/2020   LABEPIT None seen 12/25/2020   MUCUS Present 12/25/2020   BACTERIA Few (A) 12/25/2020    Pertinent Imaging:  Results for orders placed during the hospital  encounter of 04/15/21  Abdomen 1 view (KUB)  Addendum 04/16/2021 10:10 AM ADDENDUM REPORT: 04/16/2021 10:08  ADDENDUM: The 9 mm calculus seen by ultrasound in left kidney region is not appreciable by radiography.   Electronically Signed By: Mordecai Applebaum III M.D. On: 04/16/2021 10:08  Narrative CLINICAL DATA:  Left flank pain  EXAM: ABDOMEN - 1 VIEW  COMPARISON:  January 07, 2021  FINDINGS: Moderate stool in colon. No bowel dilatation or air-fluid level to suggest bowel obstruction. No free air. Apparent vascular calcifications in the right upper abdomen and pelvis. Lung bases clear.  IMPRESSION: Scattered apparent vascular calcifications. No bowel obstruction or free air evident on supine examination. Lung bases clear. Moderate stool in colon.  Electronically Signed: By: Mordecai Applebaum III M.D. On: 04/16/2021 10:03  No results found for this or any previous visit.  No results found for this or any previous visit.  No results found for this or any previous visit.  Results for orders placed during the hospital encounter of 04/15/21  Ultrasound renal complete  Narrative CLINICAL DATA:  Nephrolithiasis  EXAM: RENAL / URINARY TRACT ULTRASOUND COMPLETE  COMPARISON:  February 08, 2021 renal ultrasound  FINDINGS: Right Kidney:  Renal measurements: 11.0 x 5.5 x 6.2 cm = volume: 195.5 mL. Echogenicity and renal cortical thickness are within normal limits. No mass, perinephric fluid, or hydronephrosis visualized. No sonographically demonstrable calculus or ureterectasis.  Left Kidney:  Renal measurements: 11.9 x 5.9 x 5.1 cm = volume: 188.3 mL. Echogenicity and renal cortical thickness are within normal limits. No perinephric fluid or hydronephrosis visualized. There is a cyst in the lower pole left kidney region measuring 1.8 x 1.5 x 1.5 cm with an immediately adjacent 9 mm calculus. A cysts in the mid left kidney measures 1.7 x 1.4 x 1.7 cm. No  ureterectasis.  Bladder:  Appears normal for degree of bladder distention.  Other:  None.  IMPRESSION: 1. Cysts in left kidney. There is a 9 mm calculus immediately adjacent to a cyst in the lower pole region. No obstructing calculus evident on either side.  2.  Right kidney appears normal.  3.  No urinary bladder lesions evident.   Electronically Signed By: Mordecai Applebaum III M.D. On: 04/16/2021 10:07  No results found for this or any previous visit.  No results found for this or any previous visit.  No results found for this or any previous visit.   Assessment & Plan:    1. Urinary frequency (Primary) Uroxatral  10mg  qhs - Urinalysis, Routine w reflex microscopic - BLADDER SCAN AMB NON-IMAGING  2. Benign prostatic hyperplasia with urinary obstruction Uroxatral  10mg  qhs  3. Nephrolithiasis -KUB today, will call with results  4. Prostatitis -bactrim  DS BID for 21 days   No follow-ups on file.  Johnie Nailer, MD  [facility] [place]

## 2024-03-06 NOTE — Progress Notes (Signed)
 post void residual=0 ?

## 2024-03-10 LAB — URINE CULTURE

## 2024-03-11 ENCOUNTER — Other Ambulatory Visit: Payer: Self-pay

## 2024-03-11 ENCOUNTER — Telehealth: Payer: Self-pay | Admitting: Urology

## 2024-03-11 DIAGNOSIS — N200 Calculus of kidney: Secondary | ICD-10-CM

## 2024-03-11 MED ORDER — NITROFURANTOIN MONOHYD MACRO 100 MG PO CAPS: 100.0000 mg | ORAL_CAPSULE | Freq: Two times a day (BID) | ORAL | 0 refills | Status: DC

## 2024-03-11 NOTE — Telephone Encounter (Signed)
 Pt c/o medication issue:  1. Name of Medication: alfuzosin  (UROXATRAL ) 10 MG 24 hr tablet   2. How are you currently taking this medication (dosage and times per day)? 1 at bed time   3. Are you having a reaction (difficulty breathing--STAT)?  No   4. What is your medication issue?  Making his heart rate stay at around 100    Patient also has questions about the antibiotic you gave him . He thinks he needs a different kind because of his mychart results?

## 2024-03-11 NOTE — Telephone Encounter (Signed)
 Called pt to notify him of MD response to his medication issue MD verbally stated he believes the elavated heart rate could be due to the abx and change abx for pt pt voiced his understanding and verified correct pharmacy to send Rx

## 2024-03-12 ENCOUNTER — Encounter: Payer: Self-pay | Admitting: Urology

## 2024-03-12 NOTE — Patient Instructions (Signed)

## 2024-03-13 ENCOUNTER — Ambulatory Visit: Payer: Self-pay

## 2024-03-13 DIAGNOSIS — N411 Chronic prostatitis: Secondary | ICD-10-CM

## 2024-03-13 MED ORDER — CIPROFLOXACIN HCL 250 MG PO TABS: 250.0000 mg | ORAL_TABLET | Freq: Two times a day (BID) | ORAL | 0 refills | Status: AC

## 2024-03-13 NOTE — Telephone Encounter (Signed)
-----   Message from Nurse Henri Loft sent at 03/13/2024  9:25 AM EDT ----- Please call this patient and send in prescription.  Thanks! ----- Message ----- From: Marco Severs, MD Sent: 03/12/2024   9:07 PM EDT To: Roselee Cong, LPN  Macrobid  100mg  BID for 7 days ----- Message ----- From: Garner Jury Lab Results In Sent: 03/06/2024   3:36 PM EDT To: Marco Severs, MD

## 2024-03-13 NOTE — Telephone Encounter (Signed)
 Patient is made aware. Per Dr. Claretta Croft D/C Bactrim  and start Cipro. Patient voiced understanding.

## 2024-05-08 ENCOUNTER — Encounter: Payer: Self-pay | Admitting: Urology

## 2024-05-08 ENCOUNTER — Ambulatory Visit: Admitting: Urology

## 2024-05-08 VITALS — BP 136/77 | HR 66

## 2024-05-08 DIAGNOSIS — N401 Enlarged prostate with lower urinary tract symptoms: Secondary | ICD-10-CM | POA: Diagnosis not present

## 2024-05-08 DIAGNOSIS — R350 Frequency of micturition: Secondary | ICD-10-CM | POA: Diagnosis not present

## 2024-05-08 DIAGNOSIS — N200 Calculus of kidney: Secondary | ICD-10-CM

## 2024-05-08 DIAGNOSIS — N138 Other obstructive and reflux uropathy: Secondary | ICD-10-CM | POA: Diagnosis not present

## 2024-05-08 LAB — URINALYSIS, ROUTINE W REFLEX MICROSCOPIC
Bilirubin, UA: NEGATIVE
Glucose, UA: NEGATIVE
Ketones, UA: NEGATIVE
Nitrite, UA: NEGATIVE
Protein,UA: NEGATIVE
RBC, UA: NEGATIVE
Specific Gravity, UA: 1.025 (ref 1.005–1.030)
Urobilinogen, Ur: 1 mg/dL (ref 0.2–1.0)
pH, UA: 6 (ref 5.0–7.5)

## 2024-05-08 LAB — MICROSCOPIC EXAMINATION
Bacteria, UA: NONE SEEN
RBC, Urine: NONE SEEN /HPF (ref 0–2)

## 2024-05-08 NOTE — Patient Instructions (Signed)

## 2024-05-08 NOTE — Progress Notes (Signed)
 05/08/2024 1:08 PM   Cameron Chaney 11-May-1946 [id number]  Referring provider: Orpha Yancey LABOR, MD [address] [place],  [place] [address]  Followup BPH and nephrolithiasis  HPI: Cameron Chaney is a 78yo here for followup for BPh with nocturia and nephrolithiasis. IPSS 8 QOl 2 since starting uroxatral  10mg  at bedtime. Urine stream has improved. Nocturia decreased to 2x. No dysuria. His LUTS also further improved after treatment for prostatitis. KUb from last visit showed no calculi.    PMH: Past Medical History:  Diagnosis Date   Anxiety    Heartburn     Surgical History: Past Surgical History:  Procedure Laterality Date   COLONOSCOPY     EXTRACORPOREAL SHOCK WAVE LITHOTRIPSY Left 11/24/2020   Procedure: EXTRACORPOREAL SHOCK WAVE LITHOTRIPSY (ESWL);  Surgeon: Sherrilee Belvie CROME, MD;  Location: [facility];  Service: Urology;  Laterality: Left;    Home Medications:  Allergies as of 05/08/2024   No Known Allergies      Medication List        Accurate as of May 08, 2024  1:08 PM. If you have any questions, ask your nurse or doctor.          alfuzosin  10 MG 24 hr tablet Commonly known as: UROXATRAL  Take 1 tablet (10 mg total) by mouth at bedtime.   ALPRAZolam 0.5 MG tablet Commonly known as: XANAX Take 0.5 mg by mouth 3 (three) times daily.   aspirin 81 MG chewable tablet Chew 81 mg by mouth daily.   calcium carbonate 1500 (600 Ca) MG Tabs tablet Commonly known as: OSCAL Take 600 mg of elemental calcium by mouth 2 (two) times daily with a meal.   ciprofloxacin  250 MG tablet Commonly known as: Cipro  Take 1 tablet (250 mg total) by mouth 2 (two) times daily.   Fish Oil 1000 MG Caps Take 1,000 mg by mouth in the morning and at bedtime.   ondansetron  4 MG disintegrating tablet Commonly known as: Zofran  ODT Take 1 tablet (4 mg total) by mouth every 8 (eight) hours as needed for nausea or vomiting.   pantoprazole 40 MG tablet Commonly known as: PROTONIX Take  40 mg by mouth daily as needed (acid reflux).   tamsulosin  0.4 MG Caps capsule Commonly known as: FLOMAX  Take 1 capsule (0.4 mg total) by mouth daily after supper.        Allergies: No Known Allergies  Family History: No family history on file.  Social History:  reports that he has been smoking cigarettes. He has never used smokeless tobacco. He reports that he does not currently use alcohol. He reports that he does not use drugs.  ROS: All other review of systems were reviewed and are negative except what is noted above in HPI  Physical Exam: BP 136/77   Pulse 66   Constitutional:  Alert and oriented, No acute distress. HEENT: [place] AT, moist mucus membranes.  Trachea midline, no masses. Cardiovascular: No clubbing, cyanosis, or edema. Respiratory: Normal respiratory effort, no increased work of breathing. GI: Abdomen is soft, nontender, nondistended, no abdominal masses GU: No CVA tenderness.  Lymph: No cervical or inguinal lymphadenopathy. Skin: No rashes, bruises or suspicious lesions. Neurologic: Grossly intact, no focal deficits, moving all 4 extremities. Psychiatric: Normal mood and affect.  Laboratory Data: No results found for: WBC, HGB, HCT, MCV, PLT  No results found for: CREATININE  No results found for: PSA  No results found for: TESTOSTERONE  No results found for: HGBA1C  Urinalysis  Component Value Date/Time   APPEARANCEUR Clear 03/06/2024 1326   GLUCOSEU Negative 03/06/2024 1326   BILIRUBINUR Negative 03/06/2024 1326   PROTEINUR Negative 03/06/2024 1326   NITRITE Negative 03/06/2024 1326   LEUKOCYTESUR Trace (A) 03/06/2024 1326    Lab Results  Component Value Date   LABMICR See below: 03/06/2024   WBCUA 0-5 03/06/2024   LABEPIT 0-10 03/06/2024   MUCUS Present (A) 03/06/2024   BACTERIA None seen 03/06/2024    Pertinent Imaging: KUB 03/06/2024: Images reviewed and discussed with the patient  Results for orders placed in  visit on 03/06/24  Abdomen 1 view (KUB)  Narrative CLINICAL DATA:  History of kidney stones  EXAM: ABDOMEN - 1 VIEW  COMPARISON:  04/15/2021 ultrasound, CT 10/13/2020, radiograph 04/15/2021  FINDINGS: Nonobstructed gas pattern with moderate stool. No radiopaque calculi are visualized.  IMPRESSION: Nonobstructed gas pattern with moderate stool. No radiopaque calculi are visualized.   Electronically Signed By: Luke Bun M.D. On: 03/15/2024 16:10  No results found for this or any previous visit.  No results found for this or any previous visit.  No results found for this or any previous visit.  Results for orders placed during the hospital encounter of 04/15/21  Ultrasound renal complete  Narrative CLINICAL DATA:  Nephrolithiasis  EXAM: RENAL / URINARY TRACT ULTRASOUND COMPLETE  COMPARISON:  February 08, 2021 renal ultrasound  FINDINGS: Right Kidney:  Renal measurements: 11.0 x 5.5 x 6.2 cm = volume: 195.5 mL. Echogenicity and renal cortical thickness are within normal limits. No mass, perinephric fluid, or hydronephrosis visualized. No sonographically demonstrable calculus or ureterectasis.  Left Kidney:  Renal measurements: 11.9 x 5.9 x 5.1 cm = volume: 188.3 mL. Echogenicity and renal cortical thickness are within normal limits. No perinephric fluid or hydronephrosis visualized. There is a cyst in the lower pole left kidney region measuring 1.8 x 1.5 x 1.5 cm with an immediately adjacent 9 mm calculus. A cysts in the mid left kidney measures 1.7 x 1.4 x 1.7 cm. No ureterectasis.  Bladder:  Appears normal for degree of bladder distention.  Other:  None.  IMPRESSION: 1. Cysts in left kidney. There is a 9 mm calculus immediately adjacent to a cyst in the lower pole region. No obstructing calculus evident on either side.  2.  Right kidney appears normal.  3.  No urinary bladder lesions evident.   Electronically Signed By: Elsie Repine  III M.D. On: 04/16/2021 10:07  No results found for this or any previous visit.  No results found for this or any previous visit.  No results found for this or any previous visit.   Assessment & Plan:    1. Kidney stones (Primary) -followup 1year with a KUB - Urinalysis, Routine w reflex microscopic  2. Benign prostatic hyperplasia with urinary obstruction -continue uroxatral  10mg  qhs  3. Urinary frequency -continue uroxatrtal 10mg  qhs   No follow-ups on file.  Belvie Clara, MD  [facility] [place]

## 2025-05-12 ENCOUNTER — Ambulatory Visit: Admitting: Urology
